# Patient Record
Sex: Female | Born: 1990 | Race: White | Hispanic: No | Marital: Married | Smoking: Never smoker
Health system: Southern US, Community
[De-identification: ages and names within clinical notes are randomized; demographics above are authoritative.]

## PROBLEM LIST (undated history)

## (undated) DIAGNOSIS — R000 Tachycardia, unspecified: Secondary | ICD-10-CM

## (undated) DIAGNOSIS — I340 Nonrheumatic mitral (valve) insufficiency: Secondary | ICD-10-CM

## (undated) DIAGNOSIS — R011 Cardiac murmur, unspecified: Secondary | ICD-10-CM

## (undated) DIAGNOSIS — I4711 Inappropriate sinus tachycardia, so stated: Secondary | ICD-10-CM

## (undated) DIAGNOSIS — Z9289 Personal history of other medical treatment: Secondary | ICD-10-CM

## (undated) DIAGNOSIS — I330 Acute and subacute infective endocarditis: Secondary | ICD-10-CM

## (undated) DIAGNOSIS — I499 Cardiac arrhythmia, unspecified: Secondary | ICD-10-CM

## (undated) DIAGNOSIS — I341 Nonrheumatic mitral (valve) prolapse: Secondary | ICD-10-CM

## (undated) DIAGNOSIS — R002 Palpitations: Secondary | ICD-10-CM

## (undated) DIAGNOSIS — F988 Other specified behavioral and emotional disorders with onset usually occurring in childhood and adolescence: Secondary | ICD-10-CM

## (undated) HISTORY — DX: Nonrheumatic mitral (valve) prolapse: I34.1

## (undated) HISTORY — PX: OTHER SURGICAL HISTORY: SHX169

## (undated) HISTORY — DX: Palpitations: R00.2

## (undated) HISTORY — DX: Other specified behavioral and emotional disorders with onset usually occurring in childhood and adolescence: F98.8

## (undated) HISTORY — PX: MOUTH SURGERY: SHX715

## (undated) HISTORY — PX: DENTAL SURGERY: SHX609

## (undated) HISTORY — DX: Inappropriate sinus tachycardia, so stated: I47.11

## (undated) HISTORY — DX: Cardiac murmur, unspecified: R01.1

## (undated) HISTORY — DX: Cardiac arrhythmia, unspecified: I49.9

## (undated) HISTORY — DX: Tachycardia, unspecified: R00.0

## (undated) HISTORY — DX: Nonrheumatic mitral (valve) insufficiency: I34.0

## (undated) HISTORY — DX: Personal history of other medical treatment: Z92.89

## (undated) HISTORY — PX: WISDOM TOOTH EXTRACTION: SHX21

## (undated) HISTORY — DX: Acute and subacute infective endocarditis: I33.0

---
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
## 2010-05-14 ENCOUNTER — Other Ambulatory Visit (HOSPITAL_COMMUNITY): Payer: Self-pay | Admitting: Family Medicine

## 2010-05-14 DIAGNOSIS — N6320 Unspecified lump in the left breast, unspecified quadrant: Secondary | ICD-10-CM

## 2010-05-22 ENCOUNTER — Other Ambulatory Visit (HOSPITAL_COMMUNITY): Payer: Self-pay

## 2010-05-22 ENCOUNTER — Ambulatory Visit (HOSPITAL_COMMUNITY)
Admission: RE | Admit: 2010-05-22 | Discharge: 2010-05-22 | Disposition: A | Discharge status: Home / Self Care | Payer: BC Managed Care – PPO | Source: Ambulatory Visit | Attending: Family Medicine | Admitting: Family Medicine

## 2010-05-22 DIAGNOSIS — N6320 Unspecified lump in the left breast, unspecified quadrant: Secondary | ICD-10-CM

## 2010-05-22 DIAGNOSIS — N62 Hypertrophy of breast: Secondary | ICD-10-CM | POA: Insufficient documentation

## 2010-05-27 LAB — COMPREHENSIVE METABOLIC PANEL
AST: 22 U/L (ref 0–37)
Albumin: 4.5 g/dL (ref 3.5–5.2)
Calcium: 9.5 mg/dL (ref 8.4–10.5)
Creatinine, Ser: 0.59 mg/dL (ref 0.4–1.2)

## 2010-05-27 LAB — DIFFERENTIAL
Eosinophils Relative: 0 % (ref 0–5)
Lymphocytes Relative: 13 % — ABNORMAL LOW (ref 24–48)
Lymphs Abs: 1.3 10*3/uL (ref 1.1–4.8)
Monocytes Absolute: 0.5 10*3/uL (ref 0.2–1.2)

## 2010-05-27 LAB — CBC
MCHC: 35.9 g/dL (ref 31.0–37.0)
MCV: 91.1 fL (ref 78.0–98.0)
Platelets: 240 10*3/uL (ref 150–400)
WBC: 10.2 10*3/uL (ref 4.5–13.5)

## 2010-05-27 LAB — PREGNANCY, URINE: Preg Test, Ur: NEGATIVE

## 2010-05-27 LAB — URINALYSIS, ROUTINE W REFLEX MICROSCOPIC
Hgb urine dipstick: NEGATIVE
Protein, ur: NEGATIVE mg/dL
Urobilinogen, UA: 0.2 mg/dL (ref 0.0–1.0)

## 2010-11-28 ENCOUNTER — Encounter: Payer: Self-pay | Admitting: Internal Medicine

## 2010-11-29 ENCOUNTER — Encounter: Payer: Self-pay | Admitting: Internal Medicine

## 2010-11-29 ENCOUNTER — Ambulatory Visit (INDEPENDENT_AMBULATORY_CARE_PROVIDER_SITE_OTHER): Payer: BC Managed Care – PPO | Admitting: Internal Medicine

## 2010-11-29 VITALS — BP 112/74 | HR 91 | Ht 63.0 in | Wt 96.0 lb

## 2010-11-29 DIAGNOSIS — I341 Nonrheumatic mitral (valve) prolapse: Secondary | ICD-10-CM

## 2010-11-29 DIAGNOSIS — R42 Dizziness and giddiness: Secondary | ICD-10-CM

## 2010-11-29 DIAGNOSIS — I059 Rheumatic mitral valve disease, unspecified: Secondary | ICD-10-CM

## 2010-11-29 DIAGNOSIS — R002 Palpitations: Secondary | ICD-10-CM

## 2010-11-29 NOTE — Progress Notes (Signed)
HPI Patient is a 20 year old with a history of MVP and MR.  She also has a hsitory of palpitations (holter with PVCs, nonsustained SVT), orthostatic intolerance.  She was previously followed by [facility].  Seen 1 year ago. SInce seen she has done well.  She is very active going to school and working part time. She notes some  palpitations.  She denies dizziness with these.. She has Rx for atenolol, 1/2 to 1/4 as needed.  She says this makes her feel more sluggish. She has dizziness occasionally with standing, changing positions.  Admits to probably not drinking enough water.   Notes headaches which she ascribes to tension. No Known Allergies  Current Outpatient Prescriptions  Medication Sig Dispense Refill  . atenolol (TENORMIN) 25 MG tablet 1/4 daily as needed         Past Medical History  Diagnosis Date  . Mitral valve prolapse   . Attention deficit disorder   . Palpitations     Past Surgical History  Procedure Date  . None     Family History  Problem Relation Age of Onset  . Heart failure    . Coronary artery disease      History   Social History  . Marital Status: Single    Spouse Name: N/A    Number of Children: 0  . Years of Education: N/A   Occupational History  . [employer]    Social History Main Topics  . Smoking status: Never Smoker   . Smokeless tobacco: Not on file  . Alcohol Use: No  . Drug Use: No  . Sexually Active: Not on file   Other Topics Concern  . Not on file   Social History Narrative   The family lives in [place]. She is now attending [employer]. She lives at home with both parents and one sibling    Review of Systems:  All systems reviewed.  They are negative to the above problem except as previously stated.  Vital Signs: BP 94/58  Pulse 86  Ht 5\' 3"  (1.6 m)  Wt 96 lb (43.545 kg)  BMI 17.01 kg/m2  Physical Exam Patient is in NAD HEENT:  Normocephalic, atraumatic. EOMI, PERRLA.  Neck: JVP is  normal. No thyromegaly. No bruits.  Lungs: clear to auscultation. No rales no wheezes.  Heart: Regular rate and rhythm. Normal S1, S2. No S3.  II/VI systolic mumur heard best over L back.Marland Kitchen PMI not displaced.  Abdomen:  Supple, nontender. Normal bowel sounds. No masses. No hepatomegaly.  Extremities:   Good distal pulses throughout. No lower extremity edema.  Musculoskeletal :moving all extremities.  Neuro:   alert and oriented x3.  CN II-XII grossly intact.  EKG:  Sinus rhythm.  86 bpm.  rSR' in V1.     Assessment and Plan:

## 2010-12-03 DIAGNOSIS — R002 Palpitations: Secondary | ICD-10-CM | POA: Insufficient documentation

## 2010-12-03 DIAGNOSIS — R42 Dizziness and giddiness: Secondary | ICD-10-CM | POA: Insufficient documentation

## 2010-12-03 DIAGNOSIS — I341 Nonrheumatic mitral (valve) prolapse: Secondary | ICD-10-CM | POA: Insufficient documentation

## 2010-12-03 NOTE — Assessment & Plan Note (Signed)
Patient is not orthostatic on exam.  BP actually goes up with standing.  I encouraged her to increase fluid and salt intake some.

## 2010-12-03 NOTE — Assessment & Plan Note (Signed)
Do not sound hemodynamically destabilizing.  Does not appear to be tolerating atenolol now.   Fluid/salt may help. I have also given her samples of Mag Oxide to see if this helps.  She does note some coincident constipation so this may help as well. Take 1 to 2 daily.  Call in a few wks with response.

## 2010-12-03 NOTE — Assessment & Plan Note (Signed)
Patient with MVP and mild MR by echo 1 year ago.  I would like her to get an echo for baseline.  She had previously been followed yearly by G. Meredeth Ide ([facility]). No limitiations on activity.

## 2010-12-31 ENCOUNTER — Telehealth: Payer: Self-pay | Admitting: Internal Medicine

## 2010-12-31 DIAGNOSIS — I340 Nonrheumatic mitral (valve) insufficiency: Secondary | ICD-10-CM

## 2010-12-31 DIAGNOSIS — I341 Nonrheumatic mitral (valve) prolapse: Secondary | ICD-10-CM

## 2010-12-31 NOTE — Telephone Encounter (Signed)
No new recs for now.  Wait on echo.

## 2010-12-31 NOTE — Telephone Encounter (Signed)
I spoke with the pt and scheduled her for an echocardiogram on 01/02/11. The pt is also complaining of problems at night.  Three nights ago the pt went to bed and woke up after an hour with her "heart beating hard in her head and ear". The pt could not go back to sleep for 2 hours and then she decided to take a Mag Ox and within 30 minutes she felt better. Two nights ago the same symptoms occurred as soon as she laid down to sleep.  The pt took another Mag Ox and her symptoms improved.  Last night the pt's symptoms started again when she laid down.  The pt took a Mag Ox and it did not help.  The pt only slept 3 hours last night.  The pt does also complain of nausea and stomach upset since taking Mag Ox. Since the pt's office visit the pt has been taking Mag Ox once a day with the exception of the past three nights. The pt is not going to take Mag Ox today.  I will forward this information to Dr Tenny Craw and Annice Pih RN to review and make further recommendations.

## 2010-12-31 NOTE — Telephone Encounter (Signed)
Pt calling re scheduling a poss echo, she thinks, need order, also has questions re magnesium and how's she been feeling

## 2011-01-02 ENCOUNTER — Ambulatory Visit (HOSPITAL_COMMUNITY): Payer: BC Managed Care – PPO | Attending: Cardiovascular Disease | Admitting: Radiology

## 2011-01-02 DIAGNOSIS — I379 Nonrheumatic pulmonary valve disorder, unspecified: Secondary | ICD-10-CM | POA: Insufficient documentation

## 2011-01-02 DIAGNOSIS — I059 Rheumatic mitral valve disease, unspecified: Secondary | ICD-10-CM | POA: Insufficient documentation

## 2011-01-02 DIAGNOSIS — I079 Rheumatic tricuspid valve disease, unspecified: Secondary | ICD-10-CM | POA: Insufficient documentation

## 2011-01-02 DIAGNOSIS — R42 Dizziness and giddiness: Secondary | ICD-10-CM | POA: Insufficient documentation

## 2011-01-02 DIAGNOSIS — I340 Nonrheumatic mitral (valve) insufficiency: Secondary | ICD-10-CM

## 2011-01-02 DIAGNOSIS — I341 Nonrheumatic mitral (valve) prolapse: Secondary | ICD-10-CM

## 2011-01-08 ENCOUNTER — Telehealth: Payer: Self-pay | Admitting: *Deleted

## 2011-01-08 DIAGNOSIS — R002 Palpitations: Secondary | ICD-10-CM

## 2011-01-08 MED ORDER — DILTIAZEM HCL 30 MG PO TABS
30.0000 mg | ORAL_TABLET | Freq: Every day | ORAL | Status: AC | PRN
Start: 2011-01-08 — End: 2012-01-08

## 2011-01-08 NOTE — Telephone Encounter (Signed)
Spoke with pt, she is aware dr Tenny Craw recommends an event monitor and diltiazem 30 mg. Script sent to pharm and order for monitor done. Pt will call in a couple weeks to report how she is doing Erin Rich

## 2011-01-14 ENCOUNTER — Encounter: Payer: Self-pay | Admitting: Internal Medicine

## 2011-02-03 ENCOUNTER — Telehealth: Payer: Self-pay | Admitting: Internal Medicine

## 2011-02-03 NOTE — Telephone Encounter (Signed)
Called Windell Moulding /re holter monitor, msg left to call back to triage room.

## 2011-02-03 NOTE — Telephone Encounter (Signed)
Spoke with mother, afraid her daughter is not being handled correctly. Someone called pt today asking if holter monitor was being worn, pt said yes dont you have my recordings? Windell Moulding did call pt back and informed her we are receiving her recordings. I see the order in the chart and I think someone called to be sure the monitor was on because there is no documentation stating that the monitor was placed or mailed. I confirmed with Windell Moulding monitor on and receiving reports. Pts mom stated her daughter is not taking diltiazem yet but will get it filled. Pt afraid it will make her feel bad like the Atenolol did. Told her this is a different drug class and will need to try. Mother agrees and will have med filled and told to take med on as needed basis for palpitations. Mother agreed they would like a f/u app, they don't feel they are on the same page and so I told her I would forward information to her nurse and to expect a call back either 12/18 or 12/20  re: app for f/u. I didn't have availability till 03/10/11. Please advise.

## 2011-02-03 NOTE — Telephone Encounter (Signed)
New message:  Pt's mom called and wanted to know if the holtor results are being reviewed by Dr. Tenny Craw.  She has been wearing this for 3 weeks and has heard nothing about these results even though she has been sending in reports.  Got a v mail asking if she had received the monitor.  Please call and discuss with mom about this situation.  Concerned that it is a mis- communication.  [phone].  Aware that Annice Pih is not taking calls this pm.

## 2011-02-06 ENCOUNTER — Telehealth: Payer: Self-pay | Admitting: Internal Medicine

## 2011-02-06 NOTE — Telephone Encounter (Signed)
Called patient Event monitor so far has no arrhythmias.   She is done with school  She said her palpitations have improved signif since she has finished classes She could try Dilt 30 1x per day and see what this does. Will call her once all strips in.

## 2011-02-06 NOTE — Telephone Encounter (Signed)
Dr.Ross called patient back. So far all strips show NSR. Will continue to monitor.

## 2011-02-27 ENCOUNTER — Telehealth: Payer: Self-pay | Admitting: *Deleted

## 2011-02-27 NOTE — Telephone Encounter (Signed)
LMOM . No sig. Arrhythmias on event monitor since last phone call. Call back if sh has any questions.

## 2011-09-08 ENCOUNTER — Telehealth: Payer: Self-pay | Admitting: Internal Medicine

## 2011-09-08 NOTE — Telephone Encounter (Signed)
Called patient back. Advised that we don't have to set up echo now. Will see Dr.Ross on 11/1 and she will decide if she needs another heart ultrasound. She was concerned that if we needed an echo it could be completed before the end of the year. Advised that we could always have time to schedule this if needed before the year is up Patient verbalized understanding.

## 2011-09-08 NOTE — Telephone Encounter (Signed)
Please return call to patient at [phone]  Patient would like to know if she will need an echo this year.

## 2011-12-18 ENCOUNTER — Ambulatory Visit (INDEPENDENT_AMBULATORY_CARE_PROVIDER_SITE_OTHER): Payer: BC Managed Care – PPO | Admitting: Obstetrics and Gynecology

## 2011-12-18 ENCOUNTER — Encounter: Payer: Self-pay | Admitting: Obstetrics and Gynecology

## 2011-12-18 VITALS — BP 100/56 | Ht 62.5 in | Wt 100.0 lb

## 2011-12-18 DIAGNOSIS — Z309 Encounter for contraceptive management, unspecified: Secondary | ICD-10-CM

## 2011-12-18 DIAGNOSIS — Z01419 Encounter for gynecological examination (general) (routine) without abnormal findings: Secondary | ICD-10-CM

## 2011-12-18 NOTE — Progress Notes (Signed)
The patient reports:wants to talk about BCP Pt has getting married in August 2014  Contraception:abstinence  Last mammogram: n/a Last pap: n/a  GC/Chlamydia cultures offered: declined HIV/RPR/HbsAg offered:  declined HSV 1 and 2 glycoprotein offered: declined  Menstrual cycle regular and monthly: Yes Menstrual flow normal: Yes  Urinary symptoms: none Normal bowel movements: Yes Reports abuse at home: No  Subjective:    Erin Rich is a 21 y.o. female, No obstetric history on file., who presents for an annual exam.     History   Social History  . Marital Status: Single    Spouse Name: N/A    Number of Children: 0  . Years of Education: N/A   Occupational History  . [employer]    Social History Main Topics  . Smoking status: Never Smoker   . Smokeless tobacco: None  . Alcohol Use: No  . Drug Use: No  . Sexually Active: Yes   Other Topics Concern  . None   Social History Narrative   The family lives in [place]. She is now attending [employer]. She lives at home with both parents and one sibling    Menstrual cycle:   LMP: Patient's last menstrual period was 11/30/2011.           Cycle: normal  The following portions of the patient's history were reviewed and updated as appropriate: allergies, current medications, past family history, past medical history, past social history, past surgical history and problem list.  Review of Systems Pertinent items are noted in HPI. Breast:Negative for breast lump,nipple discharge or nipple retraction Gastrointestinal: Negative for abdominal pain, change in bowel habits or rectal bleeding Urinary:negative   Objective:    BP 100/56  Ht 5' 2.5" (1.588 m)  Wt 100 lb (45.36 kg)  BMI 18.00 kg/m2  LMP 11/30/2011    Weight:  Wt Readings from Last 1 Encounters:  12/18/11 100 lb (45.36 kg)          BMI: Body mass index is 18.00 kg/(m^2).  General Appearance: Alert, appropriate appearance  for age. No acute distress HEENT: Grossly normal Neck / Thyroid: Supple, no masses, nodes or enlargement Lungs: clear to auscultation bilaterally Back: No CVA tenderness Breast Exam: No masses or nodes.No dimpling, nipple retraction or discharge. Cardiovascular: Regular rate and rhythm. S1, S2, no murmur Gastrointestinal: Soft, non-tender, no masses or organomegaly Pelvic Exam: Vulva and vagina appear normal. Bimanual exam reveals normal uterus and adnexa. Rectovaginal: not indicated Lymphatic Exam: Non-palpable nodes in neck, clavicular, axillary, or inguinal regions Skin: no rash or abnormalities Neurologic: Normal gait and speech, no tremor  Psychiatric: Alert and oriented, appropriate affect.     Assessment:    Normal gyn exam Contraceptive management    Plan:    Risks and benefits given of birth control methods.  Nexplanon was reviewed with the patient  With expected benefits of: 3 year duration, high reliability at 99.9%, lack of estrogen and ease of insertion. Risks of DUB and possible difficult removal discussed  Combined oral contraceptives was reviewed with the patient      With expected benefits of: cycle control, reduction in menstrual flow and dysmenorrhea, improvement of PMS and reduction of ovarian cysts and ovarian cancer. Risks of DVT/PE discussed.  Nuvaring was also reviewed With added benefit of monthly use as opposed to daily use was also reviewed.  Tobacco use: none, withouthistory of DVT/PE. Pertinent medical history: migraines not investigated with associated symptoms: vision change   return annually  or prn STD screening: declined Contraception:  Pt to decide on birth control method.  [facility]   Silverio Lay MD

## 2011-12-19 ENCOUNTER — Ambulatory Visit: Payer: BC Managed Care – PPO | Admitting: Internal Medicine

## 2012-02-16 ENCOUNTER — Ambulatory Visit: Payer: BC Managed Care – PPO | Admitting: Internal Medicine

## 2012-03-02 ENCOUNTER — Telehealth: Payer: Self-pay

## 2012-03-02 NOTE — Telephone Encounter (Signed)
VM from pt regarding BC options  Pt states that she was seen in December by SR and discussed different BC options. States that she was told to call back upon deciding which she chooses. Pt states that she is wanting to start NuvaRing. Wanting to know if she needs to come in for another apt for this or if we can just call rx into [facility] in [place]. Also if we are able to send in Rx wanting to know best day to insert. States that her most recent cycle began on 02/26/12 and just ended today 03/01/12  Darien Ramus, CMA

## 2012-03-03 NOTE — Telephone Encounter (Signed)
OK to call in Nuvaring to start 1st Sunday after beginning of cycle. BUM for the 1st month. Follow-up in 3 months

## 2012-03-04 MED ORDER — ETONOGESTREL-ETHINYL ESTRADIOL 0.12-0.015 MG/24HR VA RING: VAGINAL_RING | VAGINAL | Status: DC

## 2012-03-04 NOTE — Telephone Encounter (Signed)
Pt advised and Rx sent to pharmacy   Darien Ramus, CMA

## 2012-04-26 ENCOUNTER — Telehealth: Payer: Self-pay

## 2012-04-26 NOTE — Telephone Encounter (Signed)
VM from pt states that she has noticed some nausea and vomiting with NuvaRing. States that she first inserted on 03/28/2012 and had vomiting. Was removed on 04/18/2012, left out for 1 week. Inserted again on 04/25/2012 had vomiting. States that this only last for the day of insertion then subsides. States that she likes the NuvaRing and is wanting to continue. Wanting to know if she can get Rx for Zofran sent to pharmacy for insertion dates.   Darien Ramus, CMA

## 2012-04-27 MED ORDER — ONDANSETRON HCL 4 MG PO TABS: 4.0000 mg | ORAL_TABLET | Freq: Three times a day (TID) | ORAL | Status: DC | PRN

## 2012-04-27 NOTE — Telephone Encounter (Signed)
OK to send Zofran 4 mg po every 8 hours as needed   #20/ 3 RF

## 2012-04-27 NOTE — Telephone Encounter (Signed)
LVM to advise pt of rx  Darien Ramus, CMA

## 2012-05-03 ENCOUNTER — Encounter: Payer: Self-pay | Admitting: *Deleted

## 2012-09-28 ENCOUNTER — Telehealth: Payer: Self-pay | Admitting: Nurse Practitioner

## 2012-09-28 MED ORDER — PREDNISONE 20 MG PO TABS: ORAL_TABLET | ORAL | Status: DC

## 2012-09-28 NOTE — Telephone Encounter (Signed)
Husband in today with contact/rhus dermatitis.  Wife now has outbreak x 3 days and spreading.  Prednisone 20 mg 9 day taper.  Follow-up by the end of the week if no better.

## 2012-09-29 ENCOUNTER — Encounter: Payer: Self-pay | Admitting: Nurse Practitioner

## 2012-09-29 ENCOUNTER — Ambulatory Visit (INDEPENDENT_AMBULATORY_CARE_PROVIDER_SITE_OTHER): Payer: BC Managed Care – PPO | Admitting: Nurse Practitioner

## 2012-09-29 VITALS — BP 124/82 | Ht 62.0 in | Wt 100.6 lb

## 2012-09-29 DIAGNOSIS — L259 Unspecified contact dermatitis, unspecified cause: Secondary | ICD-10-CM

## 2012-09-29 MED ORDER — METHYLPREDNISOLONE ACETATE 40 MG/ML IJ SUSP
40.0000 mg | Freq: Once | INTRAMUSCULAR | Status: AC
  Administered 2012-09-29: 40 mg via INTRAMUSCULAR

## 2012-09-29 NOTE — Progress Notes (Signed)
Subjective:  See note from yesterday. Patient has picked up prednisone but has not started it. Has had a significant outbreak of poison oak rash, has had this before. Minimal relief with triamcinolone. Husband has a similar rash, was seen yesterday. Rash is very pruritic. No wheezing.  Objective:   BP 124/82  Ht 5\' 2"  (1.575 m)  Wt 100 lb 9.6 oz (45.632 kg)  BMI 18.4 kg/m2 NAD. Alert, oriented. Lungs clear. Heart regular rate rhythm. Multiple pink patches with papular/slightly vesicular rash noted particularly on the trunk and arms.  Assessment:Contact dermatitis - Plan: methylPREDNISolone acetate (DEPO-MEDROL) injection 40 mg  Plan: Start prednisone taper as directed tomorrow. OTC antihistamines as directed. Call back if symptoms worsen or persist.

## 2013-10-27 ENCOUNTER — Ambulatory Visit (INDEPENDENT_AMBULATORY_CARE_PROVIDER_SITE_OTHER): Payer: PRIVATE HEALTH INSURANCE | Admitting: Nurse Practitioner

## 2013-10-27 ENCOUNTER — Ambulatory Visit: Payer: PRIVATE HEALTH INSURANCE | Admitting: Nurse Practitioner

## 2013-10-27 ENCOUNTER — Encounter: Payer: Self-pay | Admitting: Nurse Practitioner

## 2013-10-27 VITALS — BP 118/76 | Temp 98.6°F | Ht 62.0 in | Wt 107.0 lb

## 2013-10-27 DIAGNOSIS — L259 Unspecified contact dermatitis, unspecified cause: Secondary | ICD-10-CM

## 2013-10-27 DIAGNOSIS — W57XXXA Bitten or stung by nonvenomous insect and other nonvenomous arthropods, initial encounter: Secondary | ICD-10-CM

## 2013-10-27 DIAGNOSIS — S30861A Insect bite (nonvenomous) of abdominal wall, initial encounter: Secondary | ICD-10-CM

## 2013-10-27 MED ORDER — PREDNISONE 20 MG PO TABS: ORAL_TABLET | ORAL | Status: DC

## 2013-10-27 MED ORDER — METHYLPREDNISOLONE ACETATE 40 MG/ML IJ SUSP
40.0000 mg | Freq: Once | INTRAMUSCULAR | Status: AC
  Administered 2013-10-27: 40 mg via INTRAMUSCULAR

## 2013-10-27 MED ORDER — DOXYCYCLINE HYCLATE 100 MG PO TABS: 100.0000 mg | ORAL_TABLET | Freq: Two times a day (BID) | ORAL | Status: DC

## 2013-10-27 NOTE — Patient Instructions (Addendum)
skyla IUD, depo provera, nexplanon    Tick Bite Information Ticks are insects that attach themselves to the skin and draw blood for food. There are various types of ticks. Common types include wood ticks and deer ticks. Most ticks live in shrubs and grassy areas. Ticks can climb onto your body when you make contact with leaves or grass where the tick is waiting. The most common places on the body for ticks to attach themselves are the scalp, neck, armpits, waist, and groin. Most tick bites are harmless, but sometimes ticks carry germs that cause diseases. These germs can be spread to a person during the tick's feeding process. The chance of a disease spreading through a tick bite depends on:   The type of tick.  Time of year.   How long the tick is attached.   Geographic location.  HOW CAN YOU PREVENT TICK BITES? Take these steps to help prevent tick bites when you are outdoors:  Wear protective clothing. Long sleeves and long pants are best.   Wear white clothes so you can see ticks more easily.  Tuck your pant legs into your socks.   If walking on a trail, stay in the middle of the trail to avoid brushing against bushes.  Avoid walking through areas with long grass.  Put insect repellent on all exposed skin and along boot tops, pant legs, and sleeve cuffs.   Check clothing, hair, and skin repeatedly and before going inside.   Brush off any ticks that are not attached.  Take a shower or bath as soon as possible after being outdoors.  WHAT IS THE PROPER WAY TO REMOVE A TICK? Ticks should be removed as soon as possible to help prevent diseases caused by tick bites. 1. If latex gloves are available, put them on before trying to remove a tick.  2. Using fine-point tweezers, grasp the tick as close to the skin as possible. You may also use curved forceps or a tick removal tool. Grasp the tick as close to its head as possible. Avoid grasping the tick on its body. 3. Pull  gently with steady upward pressure until the tick lets go. Do not twist the tick or jerk it suddenly. This may break off the tick's head or mouth parts. 4. Do not squeeze or crush the tick's body. This could force disease-carrying fluids from the tick into your body.  5. After the tick is removed, wash the bite area and your hands with soap and water or other disinfectant such as alcohol. 6. Apply a small amount of antiseptic cream or ointment to the bite site.  7. Wash and disinfect any instruments that were used.  Do not try to remove a tick by applying a hot match, petroleum jelly, or fingernail polish to the tick. These methods do not work and may increase the chances of disease being spread from the tick bite.  WHEN SHOULD YOU SEEK MEDICAL CARE? Contact your health care provider if you are unable to remove a tick from your skin or if a part of the tick breaks off and is stuck in the skin.  After a tick bite, you need to be aware of signs and symptoms that could be related to diseases spread by ticks. Contact your health care provider if you develop any of the following in the days or weeks after the tick bite:  Unexplained fever.  Rash. A circular rash that appears days or weeks after the tick bite may indicate the  possibility of Lyme disease. The rash may resemble a target with a bull's-eye and may occur at a different part of your body than the tick bite.  Redness and swelling in the area of the tick bite.   Tender, swollen lymph glands.   Diarrhea.   Weight loss.   Cough.   Fatigue.   Muscle, joint, or bone pain.   Abdominal pain.   Headache.   Lethargy or a change in your level of consciousness.  Difficulty walking or moving your legs.   Numbness in the legs.   Paralysis.  Shortness of breath.   Confusion.   Repeated vomiting.  Document Released: 02/01/2000 Document Revised: 11/24/2012 Document Reviewed: 07/14/2012 [facility] Patient Information  2015 [place], [place]. This information is not intended to replace advice given to you by your health care provider. Make sure you discuss any questions you have with your health care provider.

## 2013-10-29 ENCOUNTER — Encounter: Payer: Self-pay | Admitting: Nurse Practitioner

## 2013-10-29 NOTE — Progress Notes (Signed)
Subjective:  Presents with complaints of a rash on her left hip area/low back area for the past several days. Very pruritic. Has spread slightly. Also a tick bite to the right lower abdominal area last week. Minimal itching at the site. No documented fever. Had a significant generalized headache yesterday which has resolved. Some generalized body aches. No specific arthralgias. No sore throat, cough or runny nose. No vomiting or diarrhea. Taking fluids well. Using condoms consistently. No plans for pregnancy. LMP third week of August.  Objective:   BP 118/76  Temp(Src) 98.6 F (37 C)  Ht  (1.575 m)  Wt 107 lb (48.535 kg)  BMI 19.57 kg/m2  LMP 10/17/2013 NAD. Alert, oriented. Lungs clear. Heart regular rate rhythm. Several lines of linear pink papular/slightly vesicular rash noted on the left hip/low back area. A small pink lesion noted at the site of tick bite on the right lower abdominal area. No other rash is noted.  Assessment: Contact dermatitis - Plan: methylPREDNISolone acetate (DEPO-MEDROL) injection 40 mg  Tick bite of abdomen, initial encounter  Plan:  Meds ordered this encounter  Medications  . predniSONE (DELTASONE) 20 MG tablet    Sig: 2 po qd x 4 d then 1 po qd x 4 d    Dispense:  12 tablet    Refill:  0    Order Specific Question:  Supervising Provider    Answer:  Merlyn Albert [[id number]]  . doxycycline (VIBRA-TABS) 100 MG tablet    Sig: Take 1 tablet (100 mg total) by mouth 2 (two) times daily.    Dispense:  20 tablet    Refill:  0    Order Specific Question:  Supervising Provider    Answer:  Merlyn Albert [[id number]]  . methylPREDNISolone acetate (DEPO-MEDROL) injection 40 mg    Sig:    Although there is no documented fever, with headache and body aches, will add doxycycline to her regimen. Patient to use condoms consistently while on medications. Call back next week if no improvement, sooner if worse. Reviewed signs of tick disease. Also OTC antihistamines as  directed for itching.

## 2013-12-27 ENCOUNTER — Ambulatory Visit (INDEPENDENT_AMBULATORY_CARE_PROVIDER_SITE_OTHER): Payer: PRIVATE HEALTH INSURANCE | Admitting: Family Medicine

## 2013-12-27 ENCOUNTER — Encounter: Payer: Self-pay | Admitting: Family Medicine

## 2013-12-27 VITALS — BP 100/62 | Temp 98.7°F | Ht 62.0 in | Wt 105.8 lb

## 2013-12-27 DIAGNOSIS — B349 Viral infection, unspecified: Secondary | ICD-10-CM

## 2013-12-27 DIAGNOSIS — J310 Chronic rhinitis: Secondary | ICD-10-CM

## 2013-12-27 DIAGNOSIS — J329 Chronic sinusitis, unspecified: Secondary | ICD-10-CM

## 2013-12-27 MED ORDER — AMOXICILLIN 400 MG/5ML PO SUSR: ORAL | Status: AC

## 2013-12-27 NOTE — Progress Notes (Signed)
   Subjective:    Patient ID: Erin Rich, female    DOB: 11/02/1990, 23 y.o.   MRN: [id number]  Cough This is a new problem. The current episode started in the past 7 days. Associated symptoms include a fever, headaches and myalgias. Treatments tried: Robitussin severe cold and Advil.   Family has had similar  Flu like symptoms  Felt achey and felt bad  Very congfested   Sig fever  Very achey skin and back  tmax 100.5 dim energy level  ibu prn fever  Last nite low gr feve  Rob severe dold and cong  tmax 100.5 tod  Gunky dischearge with the cough  Loose bm's   Pt clinic, [facility]     Review of Systems  Constitutional: Positive for fever.  Respiratory: Positive for cough.   Musculoskeletal: Positive for myalgias.  Neurological: Positive for headaches.       Objective:   Physical Exam  Alert good hydration H&T moderate nasal congestion pharynx normal neck supple. Lungs bronchial cough heart regular in rhythm.     Assessment & Plan:  Impression parainfluenza with secondary rhinosinusitis/bronchitis plan a mock suspension twice a day 10 days. Since Medicare discussed. WSL

## 2015-03-10 ENCOUNTER — Encounter (HOSPITAL_COMMUNITY): Payer: Self-pay | Admitting: *Deleted

## 2015-03-10 ENCOUNTER — Emergency Department (HOSPITAL_COMMUNITY)
Admission: EM | Admit: 2015-03-10 | Discharge: 2015-03-11 | Disposition: A | Discharge status: Home / Self Care | Payer: No Typology Code available for payment source | Attending: Emergency Medicine | Admitting: Emergency Medicine

## 2015-03-10 DIAGNOSIS — S161XXA Strain of muscle, fascia and tendon at neck level, initial encounter: Secondary | ICD-10-CM

## 2015-03-10 DIAGNOSIS — S299XXA Unspecified injury of thorax, initial encounter: Secondary | ICD-10-CM | POA: Diagnosis not present

## 2015-03-10 DIAGNOSIS — Y998 Other external cause status: Secondary | ICD-10-CM | POA: Diagnosis not present

## 2015-03-10 DIAGNOSIS — Y92410 Unspecified street and highway as the place of occurrence of the external cause: Secondary | ICD-10-CM | POA: Diagnosis not present

## 2015-03-10 DIAGNOSIS — S0990XA Unspecified injury of head, initial encounter: Secondary | ICD-10-CM | POA: Insufficient documentation

## 2015-03-10 DIAGNOSIS — Z8659 Personal history of other mental and behavioral disorders: Secondary | ICD-10-CM | POA: Insufficient documentation

## 2015-03-10 DIAGNOSIS — S39012A Strain of muscle, fascia and tendon of lower back, initial encounter: Secondary | ICD-10-CM | POA: Insufficient documentation

## 2015-03-10 DIAGNOSIS — Y9389 Activity, other specified: Secondary | ICD-10-CM | POA: Diagnosis not present

## 2015-03-10 DIAGNOSIS — Z3202 Encounter for pregnancy test, result negative: Secondary | ICD-10-CM | POA: Insufficient documentation

## 2015-03-10 DIAGNOSIS — S3992XA Unspecified injury of lower back, initial encounter: Secondary | ICD-10-CM | POA: Diagnosis present

## 2015-03-10 DIAGNOSIS — Z8679 Personal history of other diseases of the circulatory system: Secondary | ICD-10-CM | POA: Diagnosis not present
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
## 2015-03-11 ENCOUNTER — Emergency Department (HOSPITAL_COMMUNITY): Payer: No Typology Code available for payment source

## 2015-03-11 DIAGNOSIS — S39012A Strain of muscle, fascia and tendon of lower back, initial encounter: Secondary | ICD-10-CM | POA: Diagnosis not present

## 2015-03-11 LAB — POC URINE PREG, ED: Preg Test, Ur: NEGATIVE

## 2015-03-11 MED ORDER — IBUPROFEN 400 MG PO TABS
400.0000 mg | ORAL_TABLET | Freq: Once | ORAL | Status: AC
  Administered 2015-03-11: 400 mg via ORAL
  Filled 2015-03-11: qty 1
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
## 2015-03-11 NOTE — ED Notes (Signed)
Ice pack applied.  No neuro changes at time of dc
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
## 2015-03-11 NOTE — ED Notes (Signed)
Urine POC Pregnancy :   Results : NEGATIVE Control : (+)

## 2015-04-10 ENCOUNTER — Other Ambulatory Visit (INDEPENDENT_AMBULATORY_CARE_PROVIDER_SITE_OTHER): Payer: Self-pay

## 2015-04-10 ENCOUNTER — Ambulatory Visit (INDEPENDENT_AMBULATORY_CARE_PROVIDER_SITE_OTHER): Payer: Self-pay

## 2015-04-10 DIAGNOSIS — Z9289 Personal history of other medical treatment: Secondary | ICD-10-CM

## 2015-04-10 HISTORY — DX: Personal history of other medical treatment: Z92.89
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
## 2017-04-10 ENCOUNTER — Ambulatory Visit (INDEPENDENT_AMBULATORY_CARE_PROVIDER_SITE_OTHER): Payer: Self-pay | Admitting: Cardiovascular Disease

## 2017-04-10 ENCOUNTER — Other Ambulatory Visit (INDEPENDENT_AMBULATORY_CARE_PROVIDER_SITE_OTHER): Payer: Self-pay

## 2017-04-10 LAB — VAHRT HISTORIC LVEF: Ejection Fraction: 65 %

## 2018-01-09 ENCOUNTER — Encounter (INDEPENDENT_AMBULATORY_CARE_PROVIDER_SITE_OTHER): Payer: Self-pay

## 2018-01-09 ENCOUNTER — Ambulatory Visit (INDEPENDENT_AMBULATORY_CARE_PROVIDER_SITE_OTHER): Payer: No Typology Code available for payment source | Admitting: Nurse Practitioner

## 2018-01-09 VITALS — BP 111/69 | HR 85 | Temp 98.2°F | Resp 18 | Ht 62.0 in | Wt 113.0 lb

## 2018-01-09 DIAGNOSIS — J069 Acute upper respiratory infection, unspecified: Secondary | ICD-10-CM

## 2018-01-09 DIAGNOSIS — R52 Pain, unspecified: Secondary | ICD-10-CM

## 2018-01-09 LAB — POCT INFLUENZA A/B
POCT Rapid Influenza A AG: NEGATIVE
POCT Rapid Influenza B AG: NEGATIVE

## 2018-01-09 MED ORDER — ALBUTEROL SULFATE HFA 108 (90 BASE) MCG/ACT IN AERS
2.00 | INHALATION_SPRAY | Freq: Four times a day (QID) | RESPIRATORY_TRACT | 0 refills | Status: DC | PRN
Start: 2018-01-09 — End: 2019-09-16

## 2018-01-09 MED ORDER — BENZONATATE 100 MG PO CAPS
100.00 mg | ORAL_CAPSULE | Freq: Three times a day (TID) | ORAL | 0 refills | Status: AC | PRN
Start: 2018-01-09 — End: 2019-01-09

## 2018-01-09 NOTE — Patient Instructions (Addendum)
Viral Upper Respiratory Illness (Adult)    You have a viral upper respiratory illness (URI), which is another term for the common cold. This illness is contagious during the first few days. It is spread through the air by coughing and sneezing. It may also be spread by direct contact (touching the sick person and then touching your own eyes, nose, or mouth). Frequent handwashing will decrease risk of spread. Most viral illnesses go away within 7 to 10 days with rest and simple home remedies. Sometimes the illness may last for several weeks. Antibiotics will not kill a virus, and they are generally not prescribed for this condition.  Home care   If symptoms are severe, rest at home for the first 2 to 3 days. When you resume activity, don't let yourself get too tired.   Don't smoke. If you need help stopping, talk with your healthcare provider.   Avoid being exposed to cigarette smoke (yours or others').   You may use acetaminophen or ibuprofen to control pain and fever, unless another medicine was prescribed.If you have chronic liver or kidney disease, have ever had a stomach ulcer or gastrointestinal bleeding, or are taking blood-thinning medicines, talk with your healthcare provider before using these medicines. Aspirin should never be given to anyone under 18 years of age who is ill with a viral infection or fever. It may cause severe liver or brain damage.   Your appetite may be poor, so a light diet is fine. Stay well hydrated by drinking 6 to 8 glasses of fluids per day (water, soft drinks, juices, tea, or soup). Extra fluids will help loosen secretions in the nose and lungs.   Over-the-counter cold medicines will not shorten the length of time you're sick, but they may be helpful for the following symptoms: cough, sore throat, and nasal and sinus congestion. If you take prescription medicines, ask your healthcare provider or pharmacist which over-the-counter medicines are safe to use. (Note: Don't use  decongestants if you have high blood pressure.)  Follow-up care  Follow up with your healthcare provider, or as advised.  When to seek medical advice  Call your healthcare provider right away if any of these occur:   Cough with lots of colored sputum (mucus)   Severe headache; face, neck, or ear pain   Difficultyswallowingdue to throat pain   Fever of 100.4F (38C) or higher, or as directed by your healthcare provider  Call 911  Call 911 if any of these occur:   Chest pain, shortness of breath, wheezing, or difficulty breathing   Coughing up blood   Very severe pain with swallowing, especially if it goes along with a muffled voice   [employer] last reviewed this educational content on 07/18/2016   2000-2019 [employer] [address], [place] [address]. All rights reserved. This information is not intended as a substitute for professional medical care. Always follow your healthcare professional's instructions.

## 2018-01-09 NOTE — Progress Notes (Signed)
[facility]  PROGRESS NOTE     Patient: Carol Sherman   Date: 01/09/2018   MRN: [id number]       Carol Sherman is a 27 y.o. female      HISTORY     Chief Complaint   Patient presents with   . Flu like symptoms     c/o nasal congestion, producing some yellowish broan mucus, , chills, ear feeling full, and body aches onset 4-5 days. pt had been taking OTC medication but no help.           URI    This is a new problem. The problem has been rapidly worsening. There has been no fever. Associated symptoms include congestion, coughing, headaches, a plugged ear sensation, rhinorrhea, sinus pain and a sore throat. Pertinent negatives include no chest pain, ear pain, nausea, vomiting or wheezing. She has tried decongestant, antihistamine and acetaminophen for the symptoms. The treatment provided mild relief.       Review of Systems   Constitutional: Positive for activity change, appetite change, chills, diaphoresis and fatigue. Negative for fever.   HENT: Positive for congestion, rhinorrhea, sinus pain and sore throat. Negative for ear pain.    Respiratory: Positive for cough. Negative for chest tightness, shortness of breath and wheezing.    Cardiovascular: Negative for chest pain.   Gastrointestinal: Negative for nausea and vomiting.   Musculoskeletal: Positive for myalgias.   Skin: Negative for wound.   Neurological: Positive for headaches.   Hematological: Negative for adenopathy. Does not bruise/bleed easily.   Psychiatric/Behavioral: Negative for sleep disturbance.       History:  Past Medical History:   Diagnosis Date   . Irregular heart beat    . Mitral valve abscess        Past Surgical History:   Procedure Laterality Date   . DENTAL SURGERY         History reviewed. No pertinent family history.    Social History     Tobacco Use   . Smoking status: Never Smoker   . Smokeless tobacco: Never Used   Substance Use Topics   . Alcohol use: Not on file   . Drug use: Never       History reviewed.         Current Outpatient Medications:   .  albuterol (PROVENTIL HFA;VENTOLIN HFA) 108 (90 Base) MCG/ACT inhaler, Inhale 2 puffs into the lungs every 6 (six) hours as needed for Wheezing (chest heaviness), Disp: 1 Inhaler, Rfl: 0  .  benzonatate (TESSALON PERLES) 100 MG capsule, Take 1 capsule (100 mg total) by mouth 3 (three) times daily as needed for Cough, Disp: 30 capsule, Rfl: 0    Allergies   Allergen Reactions   . Azithromycin Hives       Medications and Allergies reviewed.    PHYSICAL EXAM     Vitals:    01/09/18 1102   BP: 111/69   Pulse: 85   Resp: 18   Temp: 98.2 F (36.8 C)   SpO2: 98%   Weight: 51.3 kg (113 lb)   Height: 1.575 m (5\' 2" )       Physical Exam   Constitutional: She is oriented to person, place, and time. She appears well-developed and well-nourished. No distress.   HENT:   Right Ear: External ear normal.   Left Ear: External ear normal.   Mouth/Throat: Oropharynx is clear and moist.   Eyes: Conjunctivae are normal. Right eye exhibits no discharge.  Left eye exhibits no discharge.   Neck: Neck supple.   Cardiovascular: Normal rate, regular rhythm and normal heart sounds.   Pulmonary/Chest: Effort normal and breath sounds normal. No respiratory distress. She has no wheezes.   Musculoskeletal: Normal range of motion.   Lymphadenopathy:     She has no cervical adenopathy.   Neurological: She is alert and oriented to person, place, and time.   Skin: Skin is warm and dry. She is not diaphoretic.   Psychiatric: She has a normal mood and affect.     UCC COURSE       Results     Procedure Component Value Units Date/Time    Influenza A/B [[id number]] Collected:  01/09/18     Updated:  01/09/18 1125     POCT QC Pass     POCT Rapid Influenza A AG Negative     POCT Rapid Influenza B AG Negative        No results found.    Orders Placed This Encounter   Medications   . albuterol (PROVENTIL HFA;VENTOLIN HFA) 108 (90 Base) MCG/ACT inhaler     Sig: Inhale 2 puffs into the lungs every 6 (six) hours as needed for  Wheezing (chest heaviness)     Dispense:  1 Inhaler     Refill:  0   . benzonatate (TESSALON PERLES) 100 MG capsule     Sig: Take 1 capsule (100 mg total) by mouth 3 (three) times daily as needed for Cough     Dispense:  30 capsule     Refill:  0     PROCEDURES     Procedures     ASSESSMENT     Encounter Diagnoses   Name Primary?   . Body aches Yes   . Acute upper respiratory infection, unspecified       SSESSMENT  PLAN      Rest   Fluids   Mucinex PRN   Tessalon PRN   Albuterol PRN   Follow up as needed     Discussed results and diagnosis with patient/family.  Reviewed warning signs for worsening condition, as well as, indications for follow-up with pmd and return to urgent care clinic.   Patient/family expressed understanding of instructions.    Orders Placed This Encounter   Procedures   . Influenza A/B     An After Visit Summary was printed and given to the patient.    Signed,  Herschel Senegal, NP

## 2018-01-21 ENCOUNTER — Encounter (INDEPENDENT_AMBULATORY_CARE_PROVIDER_SITE_OTHER): Payer: Self-pay

## 2018-01-21 ENCOUNTER — Ambulatory Visit (INDEPENDENT_AMBULATORY_CARE_PROVIDER_SITE_OTHER): Payer: No Typology Code available for payment source | Admitting: Family

## 2018-01-21 VITALS — BP 116/79 | HR 96 | Temp 98.7°F | Resp 14 | Ht 62.0 in | Wt 113.0 lb

## 2018-01-21 DIAGNOSIS — R059 Cough, unspecified: Secondary | ICD-10-CM

## 2018-01-21 DIAGNOSIS — R05 Cough: Secondary | ICD-10-CM

## 2018-01-21 NOTE — Progress Notes (Signed)
[facility]  PROGRESS NOTE     Patient: Carol Sherman   Date: 01/21/2018   MRN: [id number]       Diannie Willner Lager is a 27 y.o. female      HISTORY     Chief Complaint   Patient presents with   . Shortness of Breath     non productive cough,exertion upon activity, dx bronchitis, completed prednisone this morning remains on doxycycline and mucinex        27 yo female patient presents with non productive cough, exertion upon activity.  Was dx with bronchitis this past Saturday when she was out of town; completed prednisone this morning remains on doxycycline and mucinex.  Pt states she still have the lingering cough.  Denies fever/chills or body aches.       Shortness of Breath   Pertinent negatives include no abdominal pain, chest pain, ear pain, fever, headaches, neck pain, rash, rhinorrhea, sore throat, vomiting or wheezing.       Review of Systems   Constitutional: Negative for appetite change, chills, diaphoresis and fever.   HENT: Negative for congestion, ear pain, hearing loss, postnasal drip, rhinorrhea, sinus pressure, sore throat and trouble swallowing.    Eyes: Negative for discharge.   Respiratory: Positive for cough and shortness of breath. Negative for chest tightness and wheezing.    Cardiovascular: Negative for chest pain.   Gastrointestinal: Negative for abdominal pain, diarrhea, nausea and vomiting.   Musculoskeletal: Negative for arthralgias, myalgias, neck pain and neck stiffness.   Skin: Negative for rash.   Allergic/Immunologic: Negative for environmental allergies.   Neurological: Negative for dizziness and headaches.   Hematological: Negative for adenopathy.   Psychiatric/Behavioral: Negative for confusion and sleep disturbance.       History:  Past Medical History:   Diagnosis Date   . Heart murmur    . Irregular heart beat    . Mitral valve abscess        Past Surgical History:   Procedure Laterality Date   . DENTAL SURGERY         Family History   Problem Relation Age of Onset   .  Hypertension Mother    . Heart disease Maternal Grandfather    . Heart attack Maternal Grandfather    . Diabetes Maternal Grandfather        Social History     Tobacco Use   . Smoking status: Never Smoker   . Smokeless tobacco: Never Used   Substance Use Topics   . Alcohol use: Yes     Frequency: Monthly or less   . Drug use: Never       History reviewed.        Current Outpatient Medications:   .  benzonatate (TESSALON PERLES) 100 MG capsule, Take 1 capsule (100 mg total) by mouth 3 (three) times daily as needed for Cough, Disp: 30 capsule, Rfl: 0  .  doxycycline (VIBRAMYCIN) 100 MG capsule, Take 100 mg by mouth 2 (two) times daily, Disp: , Rfl:   .  albuterol (PROVENTIL HFA;VENTOLIN HFA) 108 (90 Base) MCG/ACT inhaler, Inhale 2 puffs into the lungs every 6 (six) hours as needed for Wheezing (chest heaviness), Disp: 1 Inhaler, Rfl: 0    Allergies   Allergen Reactions   . Azithromycin Hives       Medications and Allergies reviewed.    PHYSICAL EXAM     Vitals:    01/21/18 0849   BP: 116/79  Pulse: 96   Resp: 14   Temp: 98.7 F (37.1 C)   SpO2: 98%   Weight: 51.3 kg (113 lb)   Height: 1.575 m (5\' 2" )       Physical Exam   Nursing note and vitals reviewed.  Constitutional: She is oriented to person, place, and time. She appears well-developed and well-nourished. No distress.   HENT:   Head: Normocephalic and atraumatic.   Right Ear: Tympanic membrane and external ear normal.   Left Ear: Tympanic membrane and external ear normal.   Mouth/Throat: Oropharynx is clear and moist. No oropharyngeal exudate.   Eyes: Conjunctivae are normal. Right conjunctiva is not injected. Left conjunctiva is not injected. No scleral icterus.   Neck: Normal range of motion. Neck supple.   Cardiovascular: Normal rate, regular rhythm and normal heart sounds.   No murmur heard.  Pulmonary/Chest: Effort normal and breath sounds normal. No respiratory distress. She has no wheezes.   Lymphadenopathy:     She has no cervical adenopathy.    Neurological: She is alert and oriented to person, place, and time.   Skin: Skin is warm and dry. No rash noted.   Psychiatric: She has a normal mood and affect.         UCC COURSE       Results     ** No results found for the last 24 hours. **            No results found.      No orders of the defined types were placed in this encounter.        PROCEDURES     Procedures       ASSESSMENT     No diagnosis found.       SSESSMENT    PLAN     1.  Lingering cough.  Finished doxycyline as prescribed by other clinic      Increase fluid      Discussed results and diagnosis with patient/family.  Reviewed warning signs for worsening condition, as well as, indications for follow-up with pmd and return to urgent care clinic.   Patient/family expressed understanding of instructions.    No orders of the defined types were placed in this encounter.        An After Visit Summary was printed and given to the patient.      Signed,  Leighton Ruff, FNP

## 2018-01-21 NOTE — Patient Instructions (Addendum)
Increase fluids  Tessalon Perles   Tylenol or Ibuprofen as needed for fever/pain.  Follow up with your PCP if symptoms persist or worsen.      Chronic Cough withUncertain Cause (Adult)    Everyone has had a cough as part of the common cold, flu, or bronchitis. This kind of cough occurs along with an achy feeling, low-grade fever, nasal and sinus congestion, and a scratchy or sore throat. This usually gets better in 2 to 3 weeks. A cough that lasts longer than 3 weeks may be due to other causes. Your healthcare provider may refer to this as a chronic cough.  If your cough does not improve over the next 2 weeks, further testing may be needed. Follow up with your healthcare provider as advised. Cough suppressants may be recommended. Based on your exam today, the exact cause of your cough is not certain. Below are some common causes for persistent cough.  Smoker's cough  Smoker's cough doesn't go away. If you continue to smoke, it only gets worse. The cough is from irritation in the air passages. Talk to your healthcare provider about quitting. Medicines or nicotine-replacement products, like gum or the patch, may make quitting easier.  Postnasal drip  A cough that is worse at night may be due to postnasal drip. Excess mucus in the nose drains from the back of your nose to your throat. This triggers the cough reflex. Postnasal drip may be due to a sinus infection or allergy. Common allergens include dust, tobacco smoke (both inhaled and secondhand smoke), environmental pollutants, pollen, mold, pets, cleaning agents, room deodorizers, and chemical fumes. Over-the-counter antihistamines or decongestants may be helpful for allergies. A sinus infection may requires antibiotic treatment. See your healthcare provider if symptoms continue.  Medicines  Certain prescribed medicines can cause a chronic cough in some people:   ACE inhibitors for high blood pressure. These include benazepril, captopril, enalapril, fosinopril,  lisinopril, quinapril, ramipril, and others.   Beta-blockers for high blood pressure and other conditions. These include propranolol, atenolol, metoprolol, nadolol, and others.  Let your healthcare provider know if you are taking any of these. The chronic cough may mean your medicine needs to be changed.  Asthma  Cough may be the only sign of mild asthma. You may have tests to find out if asthma is causing your cough. You may also take asthma medicine on a trial basis.  Acid reflux (heartburn, GERD)  The esophagus is the tube that carries food from the mouth to the stomach. A valve at its lower end prevents stomach acids from flowing upward. If this valve does not work properly, acid from the stomach enters the esophagus. This may cause a burning pain in the upper abdomen or lower chest, belching, or cough. Symptoms are often worse when lying flat. Avoid eating or drinking before bedtime. Try using extra pillows to raise your upper body, or place 4-inch blocks under the head of your bed. You may try an over-the-counter (OTC) antacid or an acid-blocking medicine such as famotidine, cimetidine, ranitidine, esomeprazole, lansoprazole, or omeprazole. Stronger medicines for this condition can be prescribed by your healthcare provider. Ask your healthcare provider which OTC medicine to use. Depending on your current medicines, some OTC medicines may cause drug interactions and should be avoided.  Follow-up care  Follow up with your healthcare provider, or as advised, if your cough does not improve. Further testing may be needed.  Note: If an X-ray was taken, a specialist will review it. You  will be notified of any new findings that may affect your care.  When to seek medical advice  Call your healthcare provider right away if any of these occur:   Mild wheezing or difficulty breathing   Fever of 100.38F (38C) or higher, or as directed by your healthcare provider   Unexpected weight loss   Coughing up large amounts  of colored sputum or blood-tinged sputum   Night sweats (sheets and pajamas get soaking wet)  Call 911  Call 911 if any of these occur:   Coughing up blood   Moderate to severe trouble breathing or wheezing  [employer]Well last reviewed this educational content on 07/18/2016   2000-2019 [employer], [place]. [address] [place], [place] [address]. All rights reserved. This information is not intended as a substitute for professional medical care. Always follow your healthcare professional's instructions.            Viral Upper Respiratory Illness (Adult)    You have a viral upper respiratory illness (URI), which is another term for the common cold. This illness is contagious during the first few days. It is spread through the air by coughing and sneezing. It may also be spread by direct contact (touching the sick person and then touching your own eyes, nose, or mouth). Frequent handwashing will decrease risk of spread. Most viral illnesses go away within 7 to 10 days with rest and simple home remedies. Sometimes the illness may last for several weeks. Antibiotics will not kill a virus, and they are generally not prescribed for this condition.  Home care   If symptoms are severe, rest at home for the first 2 to 3 days. When you resume activity, don't let yourself get too tired.   Don't smoke. If you need help stopping, talk with your healthcare provider.   Avoid being exposed to cigarette smoke (yours or others').   You may use acetaminophen or ibuprofen to control pain and fever, unless another medicine was prescribed.If you have chronic liver or kidney disease, have ever had a stomach ulcer or gastrointestinal bleeding, or are taking blood-thinning medicines, talk with your healthcare provider before using these medicines. Aspirin should never be given to anyone under 1 years of age who is ill with a viral infection or fever. It may cause severe liver or brain damage.   Your appetite may be poor, so a  light diet is fine. Stay well hydrated by drinking 6 to 8 glasses of fluids per day (water, soft drinks, juices, tea, or soup). Extra fluids will help loosen secretions in the nose and lungs.   Over-the-counter cold medicines will not shorten the length of time you're sick, but they may be helpful for the following symptoms: cough, sore throat, and nasal and sinus congestion. If you take prescription medicines, ask your healthcare provider or pharmacist which over-the-counter medicines are safe to use. (Note: Don't use decongestants if you have high blood pressure.)  Follow-up care  Follow up with your healthcare provider, or as advised.  When to seek medical advice  Call your healthcare provider right away if any of these occur:   Cough with lots of colored sputum (mucus)   Severe headache; face, neck, or ear pain   Difficultyswallowingdue to throat pain   Fever of 100.38F (38C) or higher, or as directed by your healthcare provider  Call 911  Call 911 if any of these occur:   Chest pain, shortness of breath, wheezing, or difficulty breathing   Coughing  up blood   Very severe pain with swallowing, especially if it goes along with a muffled voice   StayWell last reviewed this educational content on 07/18/2016   2000-2019 [employer]. [address], [place] [address]. All rights reserved. This information is not intended as a substitute for professional medical care. Always follow your healthcare professional's instructions.

## 2018-02-26 ENCOUNTER — Other Ambulatory Visit (INDEPENDENT_AMBULATORY_CARE_PROVIDER_SITE_OTHER): Payer: Self-pay

## 2018-02-26 ENCOUNTER — Ambulatory Visit (INDEPENDENT_AMBULATORY_CARE_PROVIDER_SITE_OTHER): Payer: Self-pay | Admitting: Cardiovascular Disease

## 2019-01-07 ENCOUNTER — Ambulatory Visit (FREE_STANDING_LABORATORY_FACILITY): Payer: No Typology Code available for payment source | Admitting: Primary Care

## 2019-01-07 ENCOUNTER — Encounter (INDEPENDENT_AMBULATORY_CARE_PROVIDER_SITE_OTHER): Payer: Self-pay | Admitting: Primary Care

## 2019-01-07 VITALS — BP 124/81 | HR 77 | Temp 98.7°F | Resp 16 | Ht 62.0 in | Wt 115.0 lb

## 2019-01-07 DIAGNOSIS — R5383 Other fatigue: Secondary | ICD-10-CM

## 2019-01-07 DIAGNOSIS — Z20828 Contact with and (suspected) exposure to other viral communicable diseases: Secondary | ICD-10-CM

## 2019-01-07 DIAGNOSIS — Z20822 Contact with and (suspected) exposure to covid-19: Secondary | ICD-10-CM

## 2019-01-07 DIAGNOSIS — M791 Myalgia, unspecified site: Secondary | ICD-10-CM

## 2019-01-07 DIAGNOSIS — R6889 Other general symptoms and signs: Secondary | ICD-10-CM

## 2019-01-07 DIAGNOSIS — R0789 Other chest pain: Secondary | ICD-10-CM

## 2019-01-07 DIAGNOSIS — R05 Cough: Secondary | ICD-10-CM

## 2019-01-07 LAB — POCT INFLUENZA A/B
POCT Rapid Influenza A AG: NEGATIVE
POCT Rapid Influenza B AG: NEGATIVE

## 2019-01-07 NOTE — Progress Notes (Signed)
[facility]  PROGRESS NOTE     Patient: Carol Sherman   Date of Service: 01/07/2019   MRN: [id number]       Carol Sherman is a 29 y.o. female      HISTORY     Chief Complaint   Patient presents with   . Covid-19 Screening     x 3 days, sob, chills, nasal congestion, stomach ache,         HPI   Carol Sherman is a 28 y.o. female that reports to the clinic today with complaints of shortness of breath, nasal congestion and chills.    First day of symptoms:  11/17  Last day of fever >100 F:  denies  Fever up to:  denies  Pt also reports chest pressure. Has a hx of mitral valve disease. Sees cardiology. Notices palpitations.   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 Negative for appetite change and diaphoresis.   HENT: Negative for congestion, ear pain, hearing loss, postnasal drip, rhinorrhea, sinus pressure, sore throat and trouble swallowing.    Eyes: Negative for discharge.   Respiratory: Positive for cough. Negative for chest tightness, shortness of breath and wheezing.    Cardiovascular: Negative for chest pain.   Gastrointestinal: Negative for abdominal pain, diarrhea, nausea and vomiting.   Musculoskeletal: Positive for myalgias. Negative for arthralgias, neck pain and neck stiffness.   Skin: Negative for rash.   Allergic/Immunologic: Negative for environmental allergies.   Neurological: Negative for dizziness and headaches.   Hematological: Negative for adenopathy.   Psychiatric/Behavioral: Negative for confusion and sleep disturbance.       History:  Past Medical History:   Diagnosis Date   . Heart murmur    . Irregular heart beat    . Mitral valve abscess        Past Surgical History:   Procedure Laterality Date   . DENTAL  SURGERY         Family History   Problem Relation Age of Onset   . Hypertension Mother    . Heart disease Maternal Grandfather    . Heart attack Maternal Grandfather    . Diabetes Maternal Grandfather        Social History     Tobacco Use   . Smoking status: Never Smoker   . Smokeless tobacco: Never Used   Substance Use Topics   . Alcohol use: Yes     Frequency: Monthly or less   . Drug use: Never       History reviewed.        Current Outpatient Medications:   .  albuterol (PROVENTIL HFA;VENTOLIN HFA) 108 (90 Base) MCG/ACT inhaler, Inhale 2 puffs into the lungs every 6 (six) hours as needed for Wheezing (chest heaviness), Disp: 1 Inhaler, Rfl: 0  .  benzonatate (TESSALON PERLES) 100 MG capsule, Take 1 capsule (100 mg total) by mouth 3 (three) times daily as needed for Cough, Disp: 30 capsule, Rfl: 0  .  doxycycline (VIBRAMYCIN) 100 MG capsule, Take 100 mg by mouth 2 (two) times daily, Disp: , Rfl:     Allergies   Allergen  Reactions   . Azithromycin Hives       Medications and Allergies reviewed.    PHYSICAL EXAM     Vitals:    01/07/19 1058   BP: 124/81   Pulse: 77   Resp: 16   Temp: 98.7 F (37.1 C)   SpO2: 99%   Weight: 52.2 kg (115 lb)   Height: 1.575 m (5\' 2" )       Physical Exam   Nursing note and vitals reviewed.  Constitutional: She is oriented to person, place, and time. She appears well-developed and well-nourished. No distress.   HENT:   Head: Normocephalic and atraumatic.   Eyes: Conjunctivae are normal.   Cardiovascular: A regularly irregular rhythm present. Tachycardia present. Exam reveals no gallop, no S3 and no S4.   Pulmonary/Chest: Effort normal and breath sounds normal. No respiratory distress. She has no wheezes. She has no rales.   Neurological: She is alert and oriented to person, place, and time.   Skin: Skin is warm and dry.   Psychiatric: She has a normal mood and affect.         UCC COURSE       Results     Procedure Component Value Units Date/Time    Influenza A/B [[id number]]  (Normal)  Collected: 01/07/19 1112     Updated: 01/07/19 1132     POCT QC Pass     POCT Rapid Influenza A AG Negative     POCT Rapid Influenza B AG Negative            No results found.      No orders of the defined types were placed in this encounter.        PROCEDURES     EKG Read    Date/Time: 01/07/2019 12:03 PM  Performed by: Justus Memory, FNP  Authorized by: Justus Memory, FNP     Previous ECG:     Previous ECG:  Unavailable  Interpretation:     Interpretation: normal    Rate:     ECG rate:  72    ECG rate assessment: normal    Rhythm:     Rhythm: sinus rhythm    Ectopy:     Ectopy: none    QRS:     QRS axis:  Normal  Conduction:     Conduction: normal    ST segments:     ST segments:  Normal  T waves:     T waves: normal             ASSESSMENT     Encounter Diagnosis   Name Primary?   . Exposure to COVID-19 virus Yes          SSESSMENT    PLAN      Reviewed EKG with Dr. Finis Bud. NSR, no concerns for ACS.    Tested for COVID19. Lung sounds CTA, VSS, in NAD, pt sitting comfortably. Reviewed social isolation, hand washing and cleaning measures for home, ER for any worsening shortness of breath, tylenol q4-6h for bodyaches.     Discussed results and diagnosis with patient/family.  Reviewed warning signs for worsening condition, as well as, indications for follow-up with pmd and return to urgent care clinic.   Patient/family expressed understanding of instructions.    Orders Placed This Encounter   Procedures   . TASK FOR EKG   . EKG Read   . COVID-19 Team Member/1st Responder   . Influenza A/B  An After Visit Summary was printed and given to the patient.      Signed,  Renato Shin, FNP

## 2019-01-07 NOTE — Patient Instructions (Signed)
Results should appear in your Mychart in 1-3 days, if you don't see it then, please call our hotline at   [phone], open 5 days a week, Monday-Friday from 8:30am-5:00pm.   Drink plenty of fluids  Tylenol every 4-6 hours, ibuprofen every 6-8 hours for headaches, bodyaches, fevers  Robitussin for cough   ER for any shortness of breath  No work until you get your test results.    Here is some general advice:     If your test result is negative, you should stay home until you do not have a fever (without the use of fever reducing medication) for at least 24 hours AND an improvement in your symptoms    If your test result is positive, you should stay home for at least 10 days from the day your symptoms started, have no fever for at least 3 days, AND have an improvement in your symptoms      Follow-up Steps for Patients with Pending COVID-19 Testing - 08/27/2018     Thank you for choosing [facility]. During your visit you received a test for COVID-19 and the test performed takes at least 3-5 days to result. The goal for results is 3-5 days, however with the current increase in testing volumes, tests can take up to 10 days for results to be ready. We appreciate your patience.      What should I do while I am waiting for my test results?   . Take good care of yourself and be mindful of the safety of those around you:   . Rest and stay well hydrated.   . Use acetaminophen (Tylenol) to help manage fever.   Carol Sherman your hands often.   . Stay at home except to get medical care.   . Call ahead before seeing your doctor, any other medical provider, or seeking medical care at any healthcare facility.   . Isolate yourself as much as possible.   . Clean all "high touch" surfaces every day.   . Most people with COVID-19 infection will get better; however, a small number will develop worsening infection which may require more intensive treatment. Please contact your doctor or present to the nearest Emergency Department  if you develop the warning signs of more severe infection such as:   o A fever over 102 not controlled by acetaminophen (Tylenol)   o Shortness of breath or chest pain   o Worsening cough   o Increasing weakness   o Inability to tolerate oral fluids   . Some general things to think about:   o If you have a medical emergency and need to call 911, notify the person answering the phone that you have, or are being evaluated for COVID-19. If possible, put on a facemask before the ambulance arrives.   o Persons who are placed under active monitoring or facilitated self-monitoring should follow instructions provided by their local health department or occupational health professionals, as appropriate    When will I know the results of my COVID test?     After a minimum of 3-5 days has passed, there are multiple ways in which you can be informed about the results of your COVID 19 test results. Please ensure we have your most up to date contact information on file before you leave your appointment.     MyChart - you may be notified of results through your MyChart account. The easiest way to find all test results done at an [facility] facility  is through your MyChart account, [facility]'s online patient portal. This is also an easy way to connect with your [facility] primary care team for continued care, if this applies to you. If you do not already have   MyChart activated, you will be given an activation code at the time of testing. Please note that for children age 42-17 MyChart is not available.     You may receive a call from your Primary Care Physician or the physician who ordered the test with the results. You may also receive a call from [facility].   If none of the options above has helped you obtain your results, or you have additional questions, please contact the COVID 19 Notifications Call Center for further instructions at [phone], open 6 days a week, Monday-Saturday from 8:30am-5:00pm.     Again, the goal for results is 3-5  days, however with the current increase in testing volumes, tests can take up to 10 days for results to be ready. We appreciate your patience.     If you need help activating your MyChart account, please let us know. You can also download the MyChart application to your phone using the QR code below.         When can I stop isolation?    Until you receive your results, you should remain home and avoid contact with others (self-isolate).The decision to stop isolation precautions should be made on a case-by-case basis, in consultation with healthcare providers and state and local health departments.     Here is some general advice:      If your test result is negative, you should stay home until you do not have a fever (without the use of fever reducing medication) or any respiratory symptoms for at least 3 days.      If your test result is positive, you should stay home for at least 10 days from the day your symptoms started. If you are still having symptoms at the end of 10 days, continue in-home isolation until 3 days after your symptoms stop. Talk to your healthcare provider to determine what follow-up is needed. You can also schedule telemedicine visits with your primary care physician to discuss ongoing symptoms or new concerns that may arise.     For further information on what you and others in your household should do please visit the Center for Disease Control (CDC) website @ [employer].si.html     If you are ill and need to be seen by a healthcare provider, and your primary physician is not available, the [facility] are open for walk in visits.     [facility] - open M - F 8 am to 8 pm at the following locations:     [facility]: [address] [place], [place] [address]     [facility]: [address], [place], [place] [address]     Open Daily 8am - 8pm including weekends at the  following location:     [facility]: [address], [place], [place] [address]     In addition, all [facility] Departments can care for patients with all conditions, both COVID and non-COVID. Do not hesitate to seek care should you have a healthcare emergency.

## 2019-01-08 ENCOUNTER — Telehealth (INDEPENDENT_AMBULATORY_CARE_PROVIDER_SITE_OTHER): Payer: Self-pay

## 2019-01-08 ENCOUNTER — Encounter (INDEPENDENT_AMBULATORY_CARE_PROVIDER_SITE_OTHER): Payer: Self-pay | Admitting: Family Medicine

## 2019-01-08 LAB — COVID-19 (SARS-COV-2): SARS CoV 2 Overall Result: NOT DETECTED

## 2019-02-22 ENCOUNTER — Encounter (INDEPENDENT_AMBULATORY_CARE_PROVIDER_SITE_OTHER): Payer: Self-pay | Admitting: Family

## 2019-02-22 ENCOUNTER — Ambulatory Visit (INDEPENDENT_AMBULATORY_CARE_PROVIDER_SITE_OTHER): Payer: Self-pay

## 2019-02-22 ENCOUNTER — Telehealth (INDEPENDENT_AMBULATORY_CARE_PROVIDER_SITE_OTHER): Payer: Self-pay | Admitting: Family

## 2019-02-22 DIAGNOSIS — Z20822 Contact with and (suspected) exposure to covid-19: Secondary | ICD-10-CM

## 2019-02-22 DIAGNOSIS — R509 Fever, unspecified: Secondary | ICD-10-CM

## 2019-02-22 NOTE — Progress Notes (Signed)
[facility]  PROGRESS NOTE     Patient: Carol Sherman   Date: 02/22/2019   MRN: [id number]     This visit is being conducted via video and telephone.   Verbal consent has been obtained from the patient to conduct a video and telephone visit to minimize exposure to COVID-19: yes    Workplace/Department:  [employer]   Role:  Patient financial coordinator     LOUANNE VILLANOVA is a 29 y.o. female     HISTORY     No chief complaint on file.       29 yo female patient ([facility]) presents with dizziness and n/v x3 started yesterday, symptoms have subsided. Pt got the rapid test yesterday which was negative.  Today pt with fatigue and low grade fever of 99.6. Denies cough or SOB.  Pt was exposed to her father in-law who tested positive on 12/31.            First day of symptoms:  yesterday  Last day of fever >100 F:  n/a    History:  Dry or Productive cough: No  Fever: Yes  Sob/dyspnea: No  Fatigue: Yes  Sore throat: No  Congestion/ Rhinorrhea:  No  Headache:  No  GI symptoms:  Yes  Loss of taste or smell:  No  Rash: No    Review of Systems   Constitutional: Positive for fatigue and fever. Negative for appetite change and chills.   HENT: Negative for congestion, ear pain, hearing loss, postnasal drip, rhinorrhea, sinus pressure, sore throat and trouble swallowing.    Eyes: Negative for discharge.   Respiratory: Negative for cough, chest tightness, shortness of breath and wheezing.    Cardiovascular: Negative for chest pain.   Gastrointestinal: Positive for nausea and vomiting. Negative for abdominal pain and diarrhea.   Musculoskeletal: Negative for arthralgias, myalgias, neck pain and neck stiffness.   Skin: Negative for rash.   Allergic/Immunologic: Negative for environmental allergies.   Neurological: Positive for dizziness. Negative for headaches.   Hematological: Negative for adenopathy.   Psychiatric/Behavioral: Negative for confusion and sleep disturbance.       Past Medical History:   Diagnosis Date   . Heart murmur     . Irregular heart beat    . Mitral valve abscess        Past Surgical History:   Procedure Laterality Date   . DENTAL SURGERY         Family History   Problem Relation Age of Onset   . Hypertension Mother    . Heart disease Maternal Grandfather    . Heart attack Maternal Grandfather    . Diabetes Maternal Grandfather        Social History     Socioeconomic History   . Marital status: Married     Spouse name: Not on file   . Number of children: Not on file   . Years of education: Not on file   . Highest education level: Not on file   Occupational History   . Not on file   Social Needs   . Financial resource strain: Not on file   . Food insecurity     Worry: Not on file     Inability: Not on file   . Transportation needs     Medical: Not on file     Non-medical: Not on file   Tobacco Use   . Smoking status: Never Smoker   . Smokeless tobacco: Never Used  Substance and Sexual Activity   . Alcohol use: Yes     Frequency: Monthly or less   . Drug use: Never   . Sexual activity: Not on file   Lifestyle   . Physical activity     Days per week: Not on file     Minutes per session: Not on file   . Stress: Not on file   Relationships   . Social Wellsite geologist on phone: Not on file     Gets together: Not on file     Attends religious service: Not on file     Active member of club or organization: Not on file     Attends meetings of clubs or organizations: Not on file     Relationship status: Not on file   . Intimate partner violence     Fear of current or ex partner: Not on file     Emotionally abused: Not on file     Physically abused: Not on file     Forced sexual activity: Not on file   Other Topics Concern   . Not on file   Social History Narrative   . Not on file       PCP:  Pcp, None, MD    History reviewed.        Current Outpatient Medications:   .  albuterol (PROVENTIL HFA;VENTOLIN HFA) 108 (90 Base) MCG/ACT inhaler, Inhale 2 puffs into the lungs every 6 (six) hours as needed for Wheezing (chest heaviness),  Disp: 1 Inhaler, Rfl: 0  .  doxycycline (VIBRAMYCIN) 100 MG capsule, Take 100 mg by mouth 2 (two) times daily, Disp: , Rfl:     Allergies   Allergen Reactions   . Azithromycin Hives             PHYSICAL EXAM     There were no vitals filed for this visit.    Physical Exam    Constitutional:       General: Not in acute distress.     Appearance: Normal appearance. Not ill-appearing or toxic-appearing.   HENT:      Head: Normocephalic and atraumatic.   Eyes:      Conjunctiva/sclera: Conjunctivae normal.   Neck:      Musculoskeletal: Normal range of motion.   Respiratory:      Normal effort. Able to speak in full sentences.  Neurological:      Mental Status: Alert and oriented.  Psychiatric:         Mood and Affect: Mood normal.         Behavior: Behavior normal.       MEDICAL DECISION MAKING     DDX:  Viral Syndrome vs. COVID-19  [place] Team Member meets criteria for COVID testing    Patient has risk factors for severe COVID-19 infection: no.     LABS     Results     ** No results found for the last 24 hours. **          ASSESSMENT     Encounter Diagnoses   Name Primary?   . Suspected COVID-19 virus infection Yes   . Fever, unspecified           ASSESSMENT    PLAN     It is recommended that you follow up for Vehicle-side Testing. You should arrive at your scheduled time at the following location:    - [address], [place], [place] [address], parking lot.  Today at 6:30      General Instructions   o Get rest and stay well hydrated  o If you have a fever you may take acetaminophen (Tylenol) as directed  o The CDC guidelines for returning to work:   - If your COVID-19 Test is NEGATIVE you may return to work if your symptoms are resolved. Your temperature should be less than 100 Fahrenheit, without fever reducing medications, for greater than 24 hours prior to returning to work.   - If your COVID-19 test is POSITIVE you may return to work 10 days from symptom onset as long as your symptoms are resolving. Your temperature  should be less than 100 Fahrenheit, without fever reducing medications, for greater than 24 hours prior to returning to work.   o Please make sure you have activated your MyChart so that you can see your test results, office notes and detailed discharge instructions.   o Flu test results will be available same day in MyChart.  o COVID-19 test results will be available in MyChart in 3-4 days. You can also call COVID-19 Call Center: [phone] for results Monday-Saturday from 8:30-5:00 pm.       Discussed diagnosis and treatment with patient.  Reviewed warning signs for worsening condition as well as indications for follow-up with pmd, return to the urgent care center or emergency department.  Patient expressed understanding of instructions.    Patient was offered the alternative of an in person visits and they declined: yes    Reviewed [place] parameters for return to work.      Orders Placed This Encounter   Procedures   . COVID-19 Team Member/1st Responder       An After Visit Summary was transmitted to the patient via MyChart.      Time spent in discussion: 20 minutes        Signed,  Leighton Ruff, FNP

## 2019-02-22 NOTE — Patient Instructions (Signed)
It is recommended that you follow up for Vehicle-side Testing. You should arrive at your scheduled time at the following location:    - [facility] parking lot, located at [address] [place], [place] [address]  - [address], [place], [place] [address], parking lot   - [facility] parking lot, located at [address] [place], [place] [address]    General Instructions   o Get rest and stay well hydrated  o If you have a fever you may take acetaminophen (Tylenol) as directed  o The CDC guidelines for returning to work:   - If your COVID-19 Test is NEGATIVE you may return to work if your symptoms are resolved. Your temperature should be less than 100 Fahrenheit, without fever reducing medications, for greater than 24 hours prior to returning to work.   - If your COVID-19 test is POSITIVE you may return to work 10 days from symptom onset as long as your symptoms are resolving. Your temperature should be less than 100 Fahrenheit, without fever reducing medications, for greater than 24 hours prior to returning to work.   o Please make sure you have activated your MyChart so that you can see your test results, office notes and detailed discharge instructions.   o Flu test results will be available same day in MyChart.  o COVID-19 test results will be available in MyChart in 3-4 days. You can also call COVID-19 Call Center: [phone] for results Monday-Saturday from 8:30-5:00 pm.

## 2019-02-23 LAB — COVID-19 (SARS-COV-2)(SOFT): SARS CoV 2 Overall Result: NOT DETECTED

## 2019-08-24 ENCOUNTER — Encounter (INDEPENDENT_AMBULATORY_CARE_PROVIDER_SITE_OTHER): Payer: Self-pay

## 2019-08-24 DIAGNOSIS — R002 Palpitations: Secondary | ICD-10-CM

## 2019-09-05 ENCOUNTER — Ambulatory Visit
Admission: RE | Admit: 2019-09-05 | Discharge: 2019-09-05 | Disposition: A | Discharge status: Home / Self Care | Payer: No Typology Code available for payment source | Source: Ambulatory Visit | Attending: Internal Medicine | Admitting: Internal Medicine

## 2019-09-05 DIAGNOSIS — R197 Diarrhea, unspecified: Secondary | ICD-10-CM | POA: Insufficient documentation

## 2019-09-05 DIAGNOSIS — R079 Chest pain, unspecified: Secondary | ICD-10-CM | POA: Insufficient documentation

## 2019-09-05 DIAGNOSIS — R000 Tachycardia, unspecified: Secondary | ICD-10-CM | POA: Insufficient documentation

## 2019-09-05 LAB — CBC AND DIFFERENTIAL
Absolute NRBC: 0 10*3/uL (ref 0.00–0.00)
Basophils Absolute Automated: 0.02 10*3/uL (ref 0.00–0.08)
Basophils Automated: 0.2 %
Eosinophils Absolute Automated: 0.02 10*3/uL (ref 0.00–0.44)
Eosinophils Automated: 0.2 %
Hematocrit: 39.8 % (ref 34.7–43.7)
Hgb: 13.4 g/dL (ref 11.4–14.8)
Immature Granulocytes Absolute: 0.03 10*3/uL (ref 0.00–0.07)
Immature Granulocytes: 0.4 %
Lymphocytes Absolute Automated: 1.24 10*3/uL (ref 0.42–3.22)
Lymphocytes Automated: 15.2 %
MCH: 31.1 pg (ref 25.1–33.5)
MCHC: 33.7 g/dL (ref 31.5–35.8)
MCV: 92.3 fL (ref 78.0–96.0)
MPV: 10.7 fL (ref 8.9–12.5)
Monocytes Absolute Automated: 0.34 10*3/uL (ref 0.21–0.85)
Monocytes: 4.2 %
Neutrophils Absolute: 6.53 10*3/uL — ABNORMAL HIGH (ref 1.10–6.33)
Neutrophils: 79.8 %
Nucleated RBC: 0 /100 WBC (ref 0.0–0.0)
Platelets: 245 10*3/uL (ref 142–346)
RBC: 4.31 10*6/uL (ref 3.90–5.10)
RDW: 13 % (ref 11–15)
WBC: 8.18 10*3/uL (ref 3.10–9.50)

## 2019-09-05 LAB — TROPONIN I: Troponin I: <0.01 ng/mL (ref 0.00–0.05)

## 2019-09-05 LAB — COMPREHENSIVE METABOLIC PANEL
ALT: 13 U/L (ref 0–55)
AST (SGOT): 19 U/L (ref 5–34)
Albumin/Globulin Ratio: 1.7 (ref 0.9–2.2)
Albumin: 4.8 g/dL (ref 3.5–5.0)
Alkaline Phosphatase: 53 U/L (ref 37–106)
Anion Gap: 9 (ref 5.0–15.0)
BUN: 12 mg/dL (ref 7.0–19.0)
Bilirubin, Total: 0.6 mg/dL (ref 0.2–1.2)
CO2: 25 mEq/L (ref 21–29)
Calcium: 9.8 mg/dL (ref 8.5–10.5)
Chloride: 105 mEq/L (ref 100–111)
Creatinine: 0.7 mg/dL (ref 0.4–1.5)
Globulin: 2.9 g/dL (ref 2.0–3.7)
Glucose: 86 mg/dL (ref 70–100)
Potassium: 4.4 mEq/L (ref 3.5–5.1)
Protein, Total: 7.7 g/dL (ref 6.0–8.3)
Sodium: 139 mEq/L (ref 136–145)

## 2019-09-05 LAB — TSH: TSH: 2.02 u[IU]/mL (ref 0.35–4.94)

## 2019-09-05 LAB — IHS D-DIMER: D-Dimer: <0.22 ug/mL FEU (ref 0.00–0.60)

## 2019-09-05 LAB — GFR: EGFR: >60

## 2019-09-05 LAB — T4, FREE: T4 Free: 1.2 ng/dL (ref 0.70–1.48)

## 2019-09-05 LAB — HEMOLYSIS INDEX: Hemolysis Index: 12 (ref 0–18)

## 2019-09-06 LAB — T3, FREE: T3, Free: 3.11 pg/mL (ref 1.71–3.71)

## 2019-09-16 ENCOUNTER — Encounter (INDEPENDENT_AMBULATORY_CARE_PROVIDER_SITE_OTHER): Payer: Self-pay | Admitting: Cardiovascular Disease

## 2019-09-16 ENCOUNTER — Ambulatory Visit (INDEPENDENT_AMBULATORY_CARE_PROVIDER_SITE_OTHER): Payer: No Typology Code available for payment source | Admitting: Cardiovascular Disease

## 2019-09-16 VITALS — BP 112/66 | HR 122 | Ht 62.0 in | Wt 111.0 lb

## 2019-09-16 DIAGNOSIS — R002 Palpitations: Secondary | ICD-10-CM

## 2019-09-16 DIAGNOSIS — I341 Nonrheumatic mitral (valve) prolapse: Secondary | ICD-10-CM

## 2019-09-16 DIAGNOSIS — Z8249 Family history of ischemic heart disease and other diseases of the circulatory system: Secondary | ICD-10-CM

## 2019-09-16 NOTE — Patient Instructions (Signed)
2 Tests:   - Echocardiogram   - 14-day continuous EKG monitor ("Zio Patch")    Google these terms (if you want):    - Mitral valve prolapse syndrome   - Supraventricular tachycardia (SVT)   - Vagal maneuvers    Here is some generic patient information about these below.    I will be in touch with test results as they come back.      Mitral Valve Prolapse    The mitral valve is one of 4 valves in your heart. They open and close to control the flow of blood into and out of the heart. The mitral valve connects the left atrium to the left ventricle. Mitral valve prolapse means that the mitral valve is loose or floppy. This is often an inherited condition. It can also occur in the setting of other cardiac disease.  Most cases of mitral valve prolapse don't cause any harm and have no symptoms. In other cases, symptoms may include:   Fast, pounding, or irregular heartbeat   Chest pains   Dizziness   Fainting spells   Shortness of breath  Some people with mitral valve prolapse may have panic attacks, anxiety, or severe tiredness (fatigue). More serious symptoms are uncommon.  Home care  Benign cases of mitral valve prolapse don't need any special treatment or limits on activity.  If your healthcare provider is able to hear a "click-murmur" in your heart:   Brush and floss your teeth regularly. This will keep your gums and teeth healthy. It will lower your risk for heart valve infection.   Tell your dentist or surgeon before you have any procedure done. Antibiotics are no longer needed before dental procedures for people with benign mitral valve prolapse.  General care  Limit how much caffeine, alcohol, and stimulants you use if you have fainting spells or pounding heartbeat (palpitations) that aren't controlled with medicine. Also don't do any strenuous activity in this case.   Follow-up care  Follow up with your healthcare provider, or as advised. You may need more tests. You should have a checkup every 2 to 3 years  so your provider can look at how your valve is working.  Call 911  Call 911 if any of these occur:-   Chest pain that's new   Chest pain that gets worse or doesn't go away with rest   Ankle swelling or shortness of breath   Fluttering, racing, or pounding heartbeat (palpitations) that lasts longer than 5 minutes   Dizzy spells, lightheadedness, orfainting   Weakness or numbness in an arm or leg, or on one side of the face   Trouble speaking or seeing  StayWell last reviewed this educational content on 08/17/2017   2000-2021 [employer], [place]. All rights reserved. This information is not intended as a substitute for professional medical care. Always follow your healthcare professional's instructions.        Supraventricular Tachycardia  What is supraventricular tachycardia?  Supraventricular tachycardias (SVT) are a group of abnormally fast heart rhythms (heartbeats). It's a problem in the electrical system of the heart. The word supraventricular means above the ventricles. With SVT, the abnormal rhythm starts in the upper heart chambers (atria). Also, known as paroxysmal supraventricular tachycardia as these fast heart rhythms may start and stop abruptly and can occur with intervals of normal heart rhythm.   Normally, a special group of cells begin the electrical signal to start your heartbeat. These cells are in the sinoatrial (SA) node. In  an adult, the sinus node sends out a regular electrical pulse 60 to 100 times per minute at rest. This node is in the right atrium, the upper right chamber of your heart. The signal quickly travels down your heart's conducting system to the ventricles, the two lower chambers of your heart. Along the way, the signal moves through the atrioventricular (AV) node, a special group of cells between your atria and your ventricles. From there, the signal travels to your left and right ventricle. As it travels, the signal triggers nearby parts of your heart to contract.  This helps your heart pump in a coordinated way.   In SVT, the signal to start your heartbeat doesn't come from the SA node. Instead, it comes from another part of the left or right atrium, or from the AV node. An area outside the SA node begins to fire quickly, causing a rapid heartbeat of over 100 beats per minute. This shortens the time your ventricles have to fill. If your heartbeat is fast enough, your heart may not be able to pump enough blood forward to the rest of your body. The abnormal heart rhythm may last for a few seconds to a few hours before your heart returns to its normal rhythm.   There are several types of SVTs:    The most common type in adults is atrioventricular nodal reentrant tachycardia (AVNRT). This occurs when you have two channels through the AV node, instead of just one. The electricity can get into a looping circuit with signals going down one channel and up the other. It can occur at any age, but it most often starts in young adulthood. It's slightly more common in women.   Another common type of SVT is atrioventricular reciprocating tachycardia (AVRT). In this condition, you are born with an extra electrical connection between the atrium and the ventricle (known as an accessory pathway) that can conduct electricity. This condition allows your heart to get caught up in a looping electrical circuit. The electricity either goes down the AV node and returns back to the atrium through the accessory pathway. Or the reverse occurs with the signal traveling down the accessory pathway and returning through the AV node. This circuit continues until it's interrupted and the tachycardia stops. This type of SVT is slightly more common in younger women and children.   Atrial tachycardia is another common type of SVT. In this case, a small group of cells in the atria begin to fire abnormally, triggering the fast heartbeat. Multifocal atrial tachycardia is a related type. In this case, multiple  groups of cells in your atria fire abnormally. These types of SVT happen more often in middle-aged people. Multifocal atrial tachycardia is more common in people with heart failure or other heart or lung diseases.  In general, SVTs are somewhat uncommon. But they are not rare. Atrial fibrillation and atrial flutter are also technically types of SVT but these are usually separated into their own category because they are associated with other risks, can last for days or even years, and have a different mechanism   What causes supraventricular tachycardia?  SVT is usually a result of faulty electrical signaling in your heart. It's commonly brought on by premature beats. Some types of SVT run in families, so genes may play a role. Other types may be caused by lung problems. It can also be linked to a number of lifestyle habits or medical problems. Some of these include:    Excess caffeine or  alcohol   Heavy smoking   Certain medicines   Heart attack   Mitral valve disease  What are the symptoms of supraventricular tachycardia?  You may not have any symptoms if you have SVT. Symptoms may vary based on how long the tachycardia lasts and how fast the heart rate is. Common symptoms include:    Chest discomfort   Shortness of breath   Fatigue   Lightheadedness or dizziness   Pulsations in the neck   Unpleasant awareness of the heartbeat (palpitations)  Fainting, more severe chest pain, and nausea are less common symptoms. Very rarely can SVT cause sudden death.   How is supraventricular tachycardia diagnosed?  Diagnosis starts with a medical history and physical exam. Your healthcare provider will also use tests to help diagnose SVT. These tests will help your provider identify the type of SVT you have. They also help your provider check for possible underlying causes and complications. Tests might include:    Electrocardiogram (ECG), the most important initial test to analyze the abnormal rhythm   Continuous  electrocardiogram, to watch your heart rhythm over a longer period   Blood work, to test for various causes   Chest X-ray, to check for lung problems and examine the size of your heart   Exercise stress test, to see how your heart works during exercise   Echocardiography, to check your heart structure and function   Electrophysiologic study (EPS), to evaluate the electrical activity and pathways in your heart  Your primary healthcare provider might first diagnose your SVT. But they will likely send you to a heart doctor (cardiologist).   How is supraventricular tachycardia treated?  SVT needs short-term and long-term treatment. Options for short-term treatment include:    Maneuvers to stop SVT   Medicines to stop SVT, like calcium channel blockers, beta blockers or adenosine   Electrocardioversion. This sends a shock to the heart to get it back to a normal rhythm.   Catheter ablation  Maneuvers are usually the first treatment unless you have severe symptoms. These attempt to activate a nerve called the vagus nerve. Activating this nerve can cause a brief slowing of your heartbeat in attempt to break the abnormal circuit. Your healthcare provider might have you do a Valsalva maneuver (you bear down with your stomach muscles, as though you were trying to have a bowel movement). Your provider might also try massaging the carotid artery in your neck, having you blow in a straw, or cough hard. Each of these techniques can sometimes bring you out of SVT. If they don't, your provider might give you medicines. If your symptoms are severe or your condition is unstable, you will usually have electrocardioversion as the first treatment. .   Long-term treatment depends on the type of SVT and the intensity of symptoms. You may not need any treatment for SVT if you have only had one episode or the episodes are very rare, especially if SVT went away with maneuvers alone. In some cases, your healthcare provider may  prescribe medicines to stop SVT that you will need to take only as needed. Beta-blockers or calcium channel blockers are common choices. This may be an option for you if you have fewer than 3 episodes of SVT per year. But the medicines may often take 15 to 30 minutes to take effect. If your SVT is more frequent, you may need to take medicine every day. Some people may need to take several medicines to prevent episodes of SVT.  Catheter ablation is now often a suggested treatment for recurring SVT. In some cases, it may be the initial recommended treatment. Ablation can often cure SVT. The procedure involves placing a small catheter through a blood vessel in the groin and threaded into your heart. Your healthcare provider then performs a small burn or small freeze on the abnormal area of your heart that is causing the fast heart rhythm. Ask your healthcare provider about what treatment strategy is right for you.   How is supraventricular tachycardia managed?  Your healthcare provider might make other recommendations to manage your SVT. These might include:    Cutting back on alcohol and caffeine   Not smoking   Reducing stress   Eating a heart-healthy diet  When should I call my healthcare provider?  Call your healthcare provider if you have severe symptoms like palpitations, lightheadedness, chest pain, or sudden shortness of breath. If your symptoms are increasing in severity or frequency, plan to see your healthcare provider as soon as possible.   Key points about supraventricular tachycardia   SVT is a type of abnormal heart rhythm. Something signals an area outside of the SA node to fire much faster than it should or something triggers the signal to follow a looping circuit. This results in a fast heartbeat that can last anywhere from a few seconds to several hours.   There are several subtypes of SVT. Your treatment options may vary based on what subtype you have.   Very rarely, SVT can cause sudden  death.   You might need a shock to the heart if you are having severe symptoms from SVT.   Some people with SVT need to take medicines only when an episode of SVT happens. Others need to take medicine all the time. Ablation is often a good option for many people.   It is important to follow your healthcare provider's instructions about medicine and lifestyle management.    Next steps  Tips to help you get the most from a visit to your healthcare provider:    Know the reason for your visit and what you want to happen.   Before your visit, write down questions you want answered.   Bring someone with you to help you ask questions and remember what your provider tells you.   At the visit, write down the name of a new diagnosis, and any new medicines, treatments, or tests. Also write down any new instructions your provider gives you.   Know why a new medicine or treatment is prescribed, and how it will help you. Also know what the side effects are.   Ask if your condition can be treated in other ways.   Know why a test or procedure is recommended and what the results could mean.   Know what to expect if you do not take the medicine or have the test or procedure.   If you have a follow-up appointment, write down the date, time, and purpose for that visit.   Know how you can contact your provider if you have questions.  StayWell last reviewed this educational content on 06/17/2017   2000-2021 [employer], [place]. All rights reserved. This information is not intended as a substitute for professional medical care. Always follow your healthcare professional's instructions.

## 2019-09-16 NOTE — Progress Notes (Signed)
[facility] OFF[facility] CONSULTATION NOTE    [facility] OFFICE -CARDIOLOGY  [address]  [place] [address]  Dept: [phone]  Dept Fax: [phone]         Patient Name: Carol Sherman    Date of Visit:  September 16, 2019  Date of Birth: May 19, 1990  AGE: 29 y.o.  Medical Record #: [id number]  Requesting Physician: Fanny Bien, NP      CHIEF COMPLAINT:  Palpitations      HISTORY OF PRESENT ILLNESS    Ms. Waltner is being seen today for cardiovascular evaluation at the request of Fanny Bien, NP. She is a pleasant 29 y.o. female who has a history of mitral valve prolapse diagnosed around age 58.  There is also family history of mitral valve prolapse in both her mother and maternal grandmother.    She has dealt with occasional palpitations over the years.  These episodes have become more frequent recently.  There was one particular day in June where she had 3 distinct episodes.  She feels associated shortness of breath and presyncope during these episodes.  Occasional visual symptoms as well ("closing in").  The episodes are abrupt in onset where her heart rate abruptly rises.  It abruptly terminates as well.  The duration of the episodes is on the order of minutes, never lasting more than an hour.  She has noticed occasional triggers over the years to include caffeine, alcohol, even in small quantities, and dehydration.  Some of the episodes seemingly occur randomly though.    Other perhaps unrelated or potentially related symptoms include atypical chest pains, recent indigestion symptoms associated with belching.  None of the symptoms are reliably exertionally worsened.  The indigestion did in fact improved with recent initiation of Protonix by her primary care provider.    I reviewed multiple prior electrocardiograms dating back to 2017 which are unremarkable.  Her most recent tracing at her primary care provider's office just 1-2  weeks ago is essentially normal, demonstrating sinus rhythm at 97 bpm with no ischemic changes and a normal axis.    She does not currently endorse any chest pain, shortness of breath, dyspnea on exertion, orthopnea, PND, edema, palpitations, nausea, diaphoresis, light-headedness, dizziness, syncope, or unusual bleeding.      PAST MEDICAL HISTORY: She has a past medical history of Abdominal Aorta Ultrasound (04/10/2015), Heart murmur, Inappropriate sinus tachycardia, Irregular heart beat, Mitral regurgitation, and Mitral valve abscess. She has a past surgical history that includes Dental surgery; broken tibia (2001); and Wisdom tooth extraction.    ALLERGIES:   Allergies   Allergen Reactions   . Azithromycin Hives     MEDICATIONS:   Current Outpatient Medications   Medication Sig   . pantoprazole (Protonix) 40 MG tablet every 24 hours        FAMILY HISTORY: family history includes Diabetes in her maternal grandfather; Heart attack in her maternal grandfather; Heart disease in her maternal grandfather; Heart failure in her maternal grandfather; Hypertension in her mother; Other in her mother; Supraventricular tachycardia in an other family member.    SOCIAL HISTORY: She reports that she has never smoked. She has never used smokeless tobacco. She reports current alcohol use. She reports that she does not use drugs.    REVIEW OF SYSTEMS:   General: Denies recent weight loss, weight gain, fever or chills or change in exercise tolerance.;   Integumentary: Denies any change in hair or  nails, rashes, or skin lesions.;   Eyes: Denies diplopia, glaucoma or visual field defects.;   Ears, Nose, Throat, Mouth: Denies any hearing loss, epistaxis, hoarseness or difficulty speaking.;  Respiratory: Denies dyspnea, cough, wheezing or hemoptysis.;   Cardiovascular: Please review HPI;   Abdominal : Denies ulcer disease, hematochezia or melena.;  Musculoskeletal:Denies any venous insufficiency, arthritic symptoms or back problems.;    Neurological : Denies any recurrent strokes, TIA, or seizure disorder.;   Psychiatric: Denies any depression, substance abuse or change in cognitive functions.;   Endocrine: Denies any weight change, heat/cold intolerance, polydipsia, or polyuria;   Hematologic/Immunologic: Denies any food allergies, seasonal allergies, bleeding disorders.   All other systems reviewed and negative except as above.       PHYSICAL EXAMINATION    Visit Vitals  BP 112/66 (BP Site: Left arm, Patient Position: Sitting)   Pulse (!) 122   Ht 1.575 m (5\' 2" )   Wt 50.3 kg (111 lb)   BMI 20.30 kg/m        General Appearance:  A well-appearing female in no acute distress.    Skin: Warm and dry to touch, no apparent skin lesions, or masses noted.  Head: Normocephalic, normal hair pattern, no masses or tenderness   Eyes: EOMS Intact, PERRL, conjunctivae and lids unremarkable.  ENT: Ears, Nose and throat reveal no gross abnormalities.  No pallor or cyanosis.  Neck: JVP normal, no carotid bruit, thyroid not enlarged   Chest: Clear to auscultation bilaterally with good air movement and respiratory effort and no wheezes, rales, or rhonchi   Cardiovascular: Regular rhythm, normal rate, midsystolic click, no murmur  Abdomen: Soft, nontender, nondistended, with normoactive bowel sounds. No organomegaly.  No pulsatile masses, or bruits.   Extremities: Warm without edema. No clubbing, or cyanosis. All peripheral pulses are full and equal.   Neuro: Alert and oriented x3. No gross motor or sensory deficits noted, affect appropriate.        ECG: Sinus rhythm with a single blocked premature atrial contraction and a single premature ventricular contraction      LABS:   Lab Results   Component Value Date    WBC 8.18 09/05/2019    HGB 13.4 09/05/2019    HCT 39.8 09/05/2019    PLT 245 09/05/2019     Lab Results   Component Value Date    GLU 86 09/05/2019    BUN 12.0 09/05/2019    CREAT 0.7 09/05/2019    NA 139 09/05/2019    K 4.4 09/05/2019    CL 105 09/05/2019     CO2 25 09/05/2019    AST 19 09/05/2019    ALT 13 09/05/2019     Lab Results   Component Value Date    TSH 2.02 09/05/2019       IMPRESSION:   Ms. Pho is a 29 y.o. female with the following problems:     Abrupt onset palpitation episodes where her heart rate doubles her triples, sometimes triggered by certain known triggers such as caffeine/alcohol/dehydration, but usually seemingly at random.  These episodes have occurred infrequently throughout her life but have been accelerating in recent weeks-months.  She had 3 episodes last month in June.  They tend to last on the order of minutes and spontaneously terminate with complete normalization abruptly.  Highly suspicious for paroxysmal supraventricular tachycardia.   Mitral valve prolapse by history, prior echocardiography, and consistent on exam with a midsystolic click.  No significant mitral regurgitant murmur and the most recent  echocardiogram in 2017 demonstrated only trace mitral regurgitation.   Atypical chest pains.  Nonexertional and somewhat sharp and brief in quality.  Noncoronary, and may be noncardiac, though may be a part of mitral valve prolapse syndrome.   Family history of mitral valve prolapse in both her mother and her maternal grandmother.   Strong family history of premature atherosclerotic heart disease and numerous second and third-degree relatives including grandparents and great uncles on the mother side and a paternal aunt on the father's side.   Recent indigestion/belching symptoms with some improvement on Protonix.   Occasional premature beats on electrocardiogram today.  Otherwise a normal tracing.      RECOMMENDATIONS:     2 wee continuous ambulatory ECG monitor to hopefully capture at least one of these abrupt onset tachycardia episodes to make the arrhythmia diagnosis.   Echocardiogram to reassess the mitral valve prolapse, assess for any mitral regurgitation or mitral annular disjunction, and inform a surveillance  interval going forward.   I did give her information on mitral valve prolapse, supraventricular tachycardia, vagal maneuvers, and told her to go with the mitral valve prolapse syndrome.  We also discussed the spectrum of options for dealing with tacky arrhythmias ranging from reassurance to lifestyle changes to vagal maneuvers to as needed medications to daily suppressive medications to catheter ablation.  She has seen Dr. Sedonia Small in the past and if we do identify a supraventricular tachyarrhythmia I would refer her back to him for his input.   Further recommendations are pending the results of the aforementioned evaluation.  We will be in touch with testing results as I review them.                                                 Orders Placed This Encounter   Procedures   . Long-term Holter Monitor   . ECG 12 lead (Normal)   . Echocardiogram Adult Complete W Clr/ Dopp Waveform       No orders of the defined types were placed in this encounter.        SIGNED:    Arcola Jansky, MD         This note was generated by the Dragon speech recognition and may contain errors or omissions not intended by the user. Grammatical errors, random word insertions, deletions, pronoun errors, and incomplete sentences are occasional consequences of this technology due to software limitations. Not all errors are caught or corrected. If there are questions or concerns about the content of this note or information contained within the body of this dictation, they should be addressed directly with the author for clarification.

## 2019-10-03 ENCOUNTER — Encounter (INDEPENDENT_AMBULATORY_CARE_PROVIDER_SITE_OTHER): Payer: No Typology Code available for payment source

## 2019-10-04 ENCOUNTER — Ambulatory Visit (INDEPENDENT_AMBULATORY_CARE_PROVIDER_SITE_OTHER): Payer: No Typology Code available for payment source

## 2019-10-04 DIAGNOSIS — R002 Palpitations: Secondary | ICD-10-CM

## 2019-10-04 NOTE — Progress Notes (Signed)
Pt enrolled online for 14 day CAM, monitor was given to pt in office

## 2019-10-07 ENCOUNTER — Ambulatory Visit (INDEPENDENT_AMBULATORY_CARE_PROVIDER_SITE_OTHER): Payer: No Typology Code available for payment source | Admitting: Radiology

## 2019-10-07 DIAGNOSIS — R002 Palpitations: Secondary | ICD-10-CM

## 2019-10-07 DIAGNOSIS — I341 Nonrheumatic mitral (valve) prolapse: Secondary | ICD-10-CM

## 2019-10-10 ENCOUNTER — Encounter (INDEPENDENT_AMBULATORY_CARE_PROVIDER_SITE_OTHER): Payer: Self-pay | Admitting: Cardiovascular Disease

## 2019-10-10 ENCOUNTER — Other Ambulatory Visit (INDEPENDENT_AMBULATORY_CARE_PROVIDER_SITE_OTHER): Payer: Self-pay | Admitting: Cardiovascular Disease

## 2019-10-10 DIAGNOSIS — I341 Nonrheumatic mitral (valve) prolapse: Secondary | ICD-10-CM

## 2019-10-10 DIAGNOSIS — R002 Palpitations: Secondary | ICD-10-CM

## 2019-10-25 ENCOUNTER — Ambulatory Visit
Admission: RE | Admit: 2019-10-25 | Discharge: 2019-10-25 | Disposition: A | Discharge status: Home / Self Care | Payer: No Typology Code available for payment source | Source: Ambulatory Visit | Attending: Cardiovascular Disease | Admitting: Cardiovascular Disease

## 2019-10-25 DIAGNOSIS — I341 Nonrheumatic mitral (valve) prolapse: Secondary | ICD-10-CM | POA: Insufficient documentation

## 2019-10-25 DIAGNOSIS — R002 Palpitations: Secondary | ICD-10-CM | POA: Insufficient documentation

## 2019-10-25 MED ORDER — GADOBUTROL 1 MMOL/ML IV SOLN
20.00 mL | Freq: Once | INTRAVENOUS | Status: AC | PRN
Start: 2019-10-25 — End: 2019-10-25
  Administered 2019-10-25: 20 mmol via INTRAVENOUS
  Filled 2019-10-25: qty 20

## 2019-11-02 ENCOUNTER — Encounter (INDEPENDENT_AMBULATORY_CARE_PROVIDER_SITE_OTHER): Payer: Self-pay | Admitting: Cardiovascular Disease

## 2019-11-03 ENCOUNTER — Encounter (INDEPENDENT_AMBULATORY_CARE_PROVIDER_SITE_OTHER): Payer: No Typology Code available for payment source | Admitting: Cardiovascular Disease

## 2019-11-03 ENCOUNTER — Other Ambulatory Visit (INDEPENDENT_AMBULATORY_CARE_PROVIDER_SITE_OTHER): Payer: Self-pay | Admitting: Cardiovascular Disease

## 2019-11-03 DIAGNOSIS — I471 Supraventricular tachycardia: Secondary | ICD-10-CM | POA: Insufficient documentation

## 2019-11-03 DIAGNOSIS — R002 Palpitations: Secondary | ICD-10-CM

## 2019-11-03 NOTE — Progress Notes (Signed)
CARDIAC AMBULATORY ECG MONITOR REPORT    HRT FAIR [facility] HEART FAIR [facility] OFFICE -CARDIOLOGY  [address]  [place] [address]  Dept: [phone]  Dept Fax: [phone]    NAME: Carol Sherman    SEX: female  DOB: 11/20/1990 (29 y.o.)   MRN: [id number]     STUDY DATE: 11/03/2019   INTERPRETATION DATE: 11/03/2019     REFERRING PHYSICIAN: Fanny Bien, NP          INDICATION:   1. Palpitations    This is a 2-week continuous ambulatory ECG monitor worn between October 07, 2019 and October 20, 2019.    FINDINGS:   1. The average heart rate was 78 bpm, ranging 43-195 bpm.  2. There were no sustained atrial or ventricular tachyarrhythmias.  3. There was no nonsustained ventricular tachycardia.  4. There were a few nonsustained episodes of paroxysmal supraventricular tachycardia (SVT) which did correlate with the patient's 3 triggered symptom events (10/08/19 ~2:20pm, 10/12/19 ~7:40am, and 10/16/19 ~5:55pm).  These episodes lasted 6-15 seconds at average heart rates between 150-195 bpm.  The longest episode lasted 15 seconds at an average rate of 171 bpm  5. There was no atrial fibrillation or atrial flutter.  6. There were no bradyarrhythmias, episodes of heart block, or ventricular pauses.  7. There was a trivial frequency of benign ectopy well under 1% of the total beats.    IMPRESSIONS: Reassuring 2-week continuous ambulatory monitoring device demonstrating no sustained arrhythmias, no nonsustained ventricular tachycardia, 3 brief nonsustained SVT episodes lasting up to 15 seconds, and a very low frequency of ectopy.    RECOMMENDATIONS: Reassurance.  These results will be given to the patient.  We will discuss initiation of low-dose beta-blocker for prophylaxis.  I will also make arrangements for her to undergo an exercise treadmill stress test to ensure there were no exercise-induced arrhythmia phenomena.      The tracings for the above mentioned monitor can be requested by  calling [facility] at [phone], ext. [phone].    Elliot Dally. Eliseo Gum, MD, [facility], [facility]

## 2019-11-10 ENCOUNTER — Ambulatory Visit (INDEPENDENT_AMBULATORY_CARE_PROVIDER_SITE_OTHER): Payer: No Typology Code available for payment source | Admitting: Cardiovascular Disease

## 2019-11-10 DIAGNOSIS — I471 Supraventricular tachycardia: Secondary | ICD-10-CM

## 2019-11-10 NOTE — Procedures (Signed)
EXERCISE STRESS TEST    HRT FAIR [facility] OFFICE -CARDIOLOGY  [address]  [place] [address]  Dept: [phone]  Dept Fax: [phone]     Patient: Carol Sherman  Sex: Female   DOB: Dec 30, 1990 (28 y.o.)  MRN:  [id number]     Test Date:  11/10/2019       Interpretation Date: 11/10/2019    Referring Physician: Fanny Bien, NP     CLINICIAN: Maida Sale  SUPERVISING PROVIDER: Harlow Asa, MD [facility]    MEDICATIONS:  Patient has a current medication list which includes the following prescription(s): pantoprazole - every 24 hours.    INDICATION: Palpitations    GRADED ECG EXERCISE TEST:    ----- Protocol and Exercise Time -----   Protocol Bruce   Exercise Time (min) 10:00   ----- Heart Rate -----   Resting HR 89 BPM bpm   Maximum HR 189 bpm   METS 12 METS   Percent Maximum Predicted HR 98 %    -----Blood Pressure -----   Resting BP 106/15mmHg   Maximum BP 122/76 mmHg     Patient Symptoms: dyspnea and lightheaded, resolved during recovery    Reason for end: Dyspnea and protocol completed    Resting ECG: NSR: 89 BPM  ST Changes: Negative  Stress ECG:      Arrhythmias: Frequent PVCs    INTERPRETATION:  1. Normal blood pressure response to exercise.  2. Exercise capacity average for age and gender.  3. No chest pain or ischemic ECG changes with exercise.    CONCLUSIONS:  Normal maximal exercise treadmill test with no clinical or ECG evidence of ischemia.      Results and recommendations discussed with the patient.      INTERPRETED BY:

## 2019-11-24 ENCOUNTER — Other Ambulatory Visit (INDEPENDENT_AMBULATORY_CARE_PROVIDER_SITE_OTHER): Payer: Self-pay | Admitting: Cardiovascular Disease

## 2019-11-24 MED ORDER — METOPROLOL SUCCINATE ER 25 MG PO TB24
25.00 mg | ORAL_TABLET | Freq: Every day | ORAL | 3 refills | Status: DC
Start: 2019-11-24 — End: 2020-04-25

## 2019-12-10 ENCOUNTER — Encounter (INDEPENDENT_AMBULATORY_CARE_PROVIDER_SITE_OTHER): Payer: Self-pay

## 2020-04-11 ENCOUNTER — Emergency Department
Admission: EM | Admit: 2020-04-11 | Discharge: 2020-04-11 | Disposition: A | Discharge status: Home / Self Care | Payer: No Typology Code available for payment source | Attending: Emergency Medical Services | Admitting: Emergency Medical Services

## 2020-04-11 ENCOUNTER — Emergency Department: Payer: No Typology Code available for payment source

## 2020-04-11 DIAGNOSIS — R55 Syncope and collapse: Secondary | ICD-10-CM | POA: Insufficient documentation

## 2020-04-11 DIAGNOSIS — R112 Nausea with vomiting, unspecified: Secondary | ICD-10-CM | POA: Insufficient documentation

## 2020-04-11 DIAGNOSIS — I341 Nonrheumatic mitral (valve) prolapse: Secondary | ICD-10-CM | POA: Insufficient documentation

## 2020-04-11 DIAGNOSIS — R002 Palpitations: Secondary | ICD-10-CM

## 2020-04-11 DIAGNOSIS — I471 Supraventricular tachycardia: Secondary | ICD-10-CM

## 2020-04-11 DIAGNOSIS — R531 Weakness: Secondary | ICD-10-CM

## 2020-04-11 LAB — COMPREHENSIVE METABOLIC PANEL
ALT: 13 U/L (ref 0–55)
AST (SGOT): 18 U/L (ref 5–34)
Albumin/Globulin Ratio: 1.6 (ref 0.9–2.2)
Albumin: 4.5 g/dL (ref 3.5–5.0)
Alkaline Phosphatase: 51 U/L (ref 37–106)
Anion Gap: 9 (ref 5.0–15.0)
BUN: 15 mg/dL (ref 7–19)
Bilirubin, Total: 0.4 mg/dL (ref 0.2–1.2)
CO2: 22 mEq/L (ref 22–29)
Calcium: 8.9 mg/dL (ref 8.5–10.5)
Chloride: 107 mEq/L (ref 100–111)
Creatinine: 0.8 mg/dL (ref 0.6–1.0)
Globulin: 2.8 g/dL (ref 2.0–3.6)
Glucose: 110 mg/dL — ABNORMAL HIGH (ref 70–100)
Potassium: 3.6 mEq/L (ref 3.5–5.1)
Protein, Total: 7.3 g/dL (ref 6.0–8.3)
Sodium: 138 mEq/L (ref 136–145)

## 2020-04-11 LAB — CBC AND DIFFERENTIAL
Absolute NRBC: 0 10*3/uL (ref 0.00–0.00)
Basophils Absolute Automated: 0.02 10*3/uL (ref 0.00–0.08)
Basophils Automated: 0.5 %
Eosinophils Absolute Automated: 0.08 10*3/uL (ref 0.00–0.44)
Eosinophils Automated: 1.8 %
Hematocrit: 40.9 % (ref 34.7–43.7)
Hgb: 13.9 g/dL (ref 11.4–14.8)
Immature Granulocytes Absolute: 0.01 10*3/uL (ref 0.00–0.07)
Immature Granulocytes: 0.2 %
Lymphocytes Absolute Automated: 0.8 10*3/uL (ref 0.42–3.22)
Lymphocytes Automated: 18.1 %
MCH: 30.8 pg (ref 25.1–33.5)
MCHC: 34 g/dL (ref 31.5–35.8)
MCV: 90.7 fL (ref 78.0–96.0)
MPV: 9.4 fL (ref 8.9–12.5)
Monocytes Absolute Automated: 0.4 10*3/uL (ref 0.21–0.85)
Monocytes: 9.1 %
Neutrophils Absolute: 3.1 10*3/uL (ref 1.10–6.33)
Neutrophils: 70.3 %
Nucleated RBC: 0 /100 WBC (ref 0.0–0.0)
Platelets: 179 10*3/uL (ref 142–346)
RBC: 4.51 10*6/uL (ref 3.90–5.10)
RDW: 13 % (ref 11–15)
WBC: 4.41 10*3/uL (ref 3.10–9.50)

## 2020-04-11 LAB — LIPASE: Lipase: 42 U/L (ref 8–78)

## 2020-04-11 LAB — URINALYSIS REFLEX TO MICROSCOPIC EXAM - REFLEX TO CULTURE
Bilirubin, UA: NEGATIVE
Glucose, UA: NEGATIVE
Ketones UA: NEGATIVE
Nitrite, UA: NEGATIVE
Protein, UR: NEGATIVE
Specific Gravity UA: 1.005 (ref 1.001–1.035)
Urine pH: 7 (ref 5.0–8.0)
Urobilinogen, UA: NORMAL mg/dL (ref 0.2–2.0)

## 2020-04-11 LAB — GLUCOSE WHOLE BLOOD - POCT: Whole Blood Glucose POCT: 102 mg/dL — ABNORMAL HIGH (ref 70–100)

## 2020-04-11 LAB — HCG, SERUM, QUALITATIVE: Hcg Qualitative: NEGATIVE

## 2020-04-11 LAB — ECG 12-LEAD
Atrial Rate: 97 {beats}/min
P Axis: 69 degrees
P-R Interval: 172 ms
Q-T Interval: 370 ms
QRS Duration: 80 ms
QTC Calculation (Bezet): 469 ms
R Axis: 101 degrees
T Axis: 42 degrees
Ventricular Rate: 97 {beats}/min

## 2020-04-11 LAB — GFR: EGFR: >60

## 2020-04-11 LAB — IHS D-DIMER: D-Dimer: 0.82 ug/mL FEU — ABNORMAL HIGH (ref 0.00–0.60)

## 2020-04-11 MED ORDER — SODIUM CHLORIDE 0.9 % IV BOLUS
1000.00 mL | Freq: Once | INTRAVENOUS | Status: AC
Start: 2020-04-11 — End: 2020-04-11
  Administered 2020-04-11: 1000 mL via INTRAVENOUS

## 2020-04-11 MED ORDER — ONDANSETRON HCL 4 MG/2ML IJ SOLN
4.00 mg | Freq: Once | INTRAMUSCULAR | Status: DC
Start: 2020-04-11 — End: 2020-04-11
  Filled 2020-04-11: qty 2

## 2020-04-11 MED ORDER — ONDANSETRON 4 MG PO TBDP
4.0000 mg | ORAL_TABLET | Freq: Four times a day (QID) | ORAL | 0 refills | Status: DC | PRN
Start: 2020-04-11 — End: 2020-11-27

## 2020-04-11 MED ORDER — IOHEXOL 350 MG/ML IV SOLN
100.0000 mL | Freq: Once | INTRAVENOUS | Status: AC | PRN
Start: 2020-04-11 — End: 2020-04-11
  Administered 2020-04-11: 100 mL via INTRAVENOUS

## 2020-04-11 MED ORDER — ONDANSETRON HCL 4 MG/2ML IJ SOLN
4.00 mg | Freq: Once | INTRAMUSCULAR | Status: AC
Start: 2020-04-11 — End: 2020-04-11
  Administered 2020-04-11: 4 mg via INTRAVENOUS
  Filled 2020-04-11: qty 2

## 2020-04-11 NOTE — ED Triage Notes (Signed)
30 y/o female present to the ED with a c/o of emesis and near syncope. Reports diarrhea starting last night and continuing this AM. Reports emesis this AM and near syncope in the shower. Reports bilateral hand tingling and difficulty speaking. Reports symptoms resolving upon arrival.

## 2020-04-11 NOTE — ED Provider Notes (Signed)
[facility]        [facility] EMERGENCY DEPARTMENT HISTORY AND PHYSICAL EXAM    Date: 04/11/2020  Patient Name: Carol Sherman  Attending Physician: Clovis Riley, MD  Patient DOB:  18-Aug-1990  MRN:  [id number]  Room:  08/A08        History     Chief Complaint   Patient presents with   .  Lightheaded, hand tingling, difficulty speaking       Historian: Patient and her husband  Onset:   1 1/2 hours prior to arrival  Severity:  severe   Modifiers: worse when standing  Quality: Lightheaded, numbness and tingling in both hands, difficulty finding words     The patient Carol Sherman, is a 30 y.o. female with a history of nonsustained SVT and mitral valve prolapse who presents with lightheadedness, hand tingling, and difficulty speaking.  She has been on a modified keto diet for about 4 weeks to see if it will help her joint pain.  Yesterday she had some carbs and then developed some abdominal discomfort described as "turning and cramping" and had three episodes of diarrhea.  This morning she woke up initially felt fine and got in the shower to get ready for work.  She experienced some palpitations which is common for her but they continued and were associated with severe lightheadedness.  She then had more diarrhea and felt dehydrated so drank a significant mount of water and vomited.  She subsequently developed numbness and tingling in her hands and difficulty applying Chapstick to her dry lips and difficulty finding words.  Her husband noticed that she seemed to be having some difficulty breathing and seemed anxious.    On the way to the hospital and on arrival her symptoms have improved significantly but have not completely resolved.          PCP:  Cristela Felt, MD      Past Medical History       Past Medical History:   Diagnosis Date   . Abdominal Aorta Ultrasound 04/10/2015   . Heart murmur    . Inappropriate sinus tachycardia    . Irregular heart beat    .  Mitral regurgitation    . Mitral valve abscess          Past Surgical History       Past Surgical History:   Procedure Laterality Date   . broken tibia  2001   . DENTAL SURGERY     . WISDOM TOOTH EXTRACTION           Family History    Family History   Problem Relation Age of Onset   . Hypertension Mother    . Other Mother         Mitral Valve regurgitation   . Heart disease Maternal Grandfather    . Heart attack Maternal Grandfather    . Diabetes Maternal Grandfather    . Heart failure Maternal Grandfather    . Supraventricular tachycardia Other        Social History    Social History     Socioeconomic History   . Marital status: Married     Spouse name: None   . Number of children: None   . Years of education: None   . Highest education level: None   Occupational History   . None   Tobacco Use   . Smoking status: Never Smoker   . Smokeless tobacco: Never Used  Vaping Use   . Vaping Use: Never used   Substance and Sexual Activity   . Alcohol use: Yes   . Drug use: Never   . Sexual activity: None   Other Topics Concern   . None   Social History Narrative   . None     Social Determinants of Health     Financial Resource Strain:    . Difficulty of Paying Living Expenses: Not on file   Food Insecurity:    . Worried About Programme researcher, broadcasting/film/video in the Last Year: Not on file   . Ran Out of Food in the Last Year: Not on file   Transportation Needs:    . Lack of Transportation (Medical): Not on file   . Lack of Transportation (Non-Medical): Not on file   Physical Activity:    . Days of Exercise per Week: Not on file   . Minutes of Exercise per Session: Not on file   Stress:    . Feeling of Stress : Not on file   Social Connections:    . Frequency of Communication with Friends and Family: Not on file   . Frequency of Social Gatherings with Friends and Family: Not on file   . Attends Religious Services: Not on file   . Active Member of Clubs or Organizations: Not on file   . Attends Banker Meetings: Not on file    . Marital Status: Not on file   Intimate Partner Violence:    . Fear of Current or Ex-Partner: Not on file   . Emotionally Abused: Not on file   . Physically Abused: Not on file   . Sexually Abused: Not on file   Housing Stability:    . Unable to Pay for Housing in the Last Year: Not on file   . Number of Places Lived in the Last Year: Not on file   . Unstable Housing in the Last Year: Not on file       Allergies    Allergies   Allergen Reactions   . Azithromycin Hives         Current/Home Medications    Discharge Medication List as of 04/11/2020 12:49 PM      CONTINUE these medications which have NOT CHANGED    Details   metoprolol succinate XL (TOPROL-XL) 25 MG 24 hr tablet Take 1 tablet (25 mg total) by mouth daily, Starting Thu 11/24/2019, Until Fri 11/23/2020, E-Rx      pantoprazole (Protonix) 40 MG tablet every 24 hours, Historical Med             Home meds reviewed by ED MD       Vital Signs     BP 100/63   Pulse 83   Temp 97.9 F (36.6 C) (Oral)   Resp 22   Wt 50.7 kg   SpO2 98%   BMI 20.44 kg/m   Patient Vitals for the past 24 hrs:   BP Temp Temp src Pulse Resp SpO2 Weight   04/11/20 1300 -- -- -- 83 -- 98 % --   04/11/20 1230 100/63 -- -- 84 -- 98 % --   04/11/20 1156 118/71 -- -- (!) 101 -- 98 % --   04/11/20 1100 -- -- -- 94 -- 98 % --   04/11/20 1000 105/70 -- -- (!) 106 -- 100 % --   04/11/20 0900 102/65 -- -- 89 22 98 % --   04/11/20 0755 -- -- --  97 (!) 26 -- --   04/11/20 0741 107/76 97.9 F (36.6 C) Oral (!) 116 -- 98 % --   04/11/20 0737 -- -- -- -- 18 -- 50.7 kg         Review of Systems     Review of Systems   Constitutional: Positive for malaise/fatigue.   Respiratory: Positive for shortness of breath.    Cardiovascular: Positive for palpitations.   Gastrointestinal: Positive for abdominal pain, diarrhea, nausea and vomiting.   Neurological: Positive for dizziness and tingling.         Physical Exam     CONSTITUTIONAL   Vital Signs Reviewed, Afebrile, Pleasant  HEAD   Atraumatic,  Normocephalic.  EYES   Normal to inspection, No discharge from eyes.  ENT    Mucus membranes dry, No pharyngeal edema.    NECK   Normal inspection, No JVD.  RESPIRATORY CHEST   No respiratory distress, Breath sounds normal.  CARDIOVASCULAR   Normal rate and regular rhythm.  ABDOMEN   Non-distended, Normal bowel sounds, Soft, No tenderness, No peritoneal signs.  BACK   Normal inspection, No tenderness.  UPPER EXTREMITY   Inspection normal, No cyanosis.  LOWER EXTREMITY   No edema, No cyanosis.  NEURO   Alert and oriented, Normal speech, cranial nerves II through XII intact, no focal motor or sensory deficits, normal finger-to-nose bilaterally.  SKIN   Warm and dry without obvious rash  PSYCHIATRIC   Alert and oriented, anxious affect, Normal concentration.      ED Medication Orders     ED Medication Orders (From admission, onward)    Start Ordered     Status Ordering Provider    04/11/20 1121 04/11/20 1121  iohexol (OMNIPAQUE) 350 MG/ML injection 100 mL  IMG once as needed        Route: Intravenous  Ordered Dose: 100 mL     Last MAR action: Imaging Agent Given Tiarrah  W    04/11/20 0940 04/11/20 0939  ondansetron (ZOFRAN) injection 4 mg  Once        Route: Intravenous  Ordered Dose: 4 mg     Last [facility] action: Not Given Littie Deeds, Pj Zehner W    04/11/20 0838 04/11/20 0837  ondansetron (ZOFRAN) injection 4 mg  Once        Route: Intravenous  Ordered Dose: 4 mg     Last MAR action: Given Sonnie Bias W    04/11/20 0838 04/11/20 0837  sodium chloride 0.9 % bolus 1,000 mL  Once        Route: Intravenous  Ordered Dose: 1,000 mL     Last MAR action: Stopped Domingo Fuson W          Orders Placed During this Encounter     Orders Placed This Encounter   Procedures   . CT Angio Chest (PE study)   . CBC and differential   . Comprehensive metabolic panel   . Lipase   . Urinalysis Reflex to Microscopic Exam- Reflex to Culture   . GFR   . Beta HCG, Qual, Serum   . D-Dimer   . Diet NPO effective now   . Cardiac Event Monitor (30  days)   . Glucose Whole Blood - POCT   . ECG 12 Lead   . Saline lock IV   . Hospital Follow Up Visit (HRT [facility])   . OFFICE VISIT 30 MIN (HRT [facility])       Diagnostic Study Results     Labs  Results     Procedure Component Value Units Date/Time    D-Dimer [[id number]]  (Abnormal) Collected: 04/11/20 0757     Updated: 04/11/20 0933     D-Dimer 0.82 ug/mL FEU     Urinalysis Reflex to Microscopic Exam- Reflex to Culture [[id number]]  (Abnormal) Collected: 04/11/20 0905     Updated: 04/11/20 4401     Urine Type Urine, Clean Ca     Color, UA Straw     Clarity, UA Clear     Specific Gravity UA 1.005     Urine pH 7.0     Leukocyte Esterase, UA Trace     Nitrite, UA Negative     Protein, UR Negative     Glucose, UA Negative     Ketones UA Negative     Urobilinogen, UA Normal mg/dL      Bilirubin, UA Negative     Blood, UA Moderate     RBC, UA 3 - 5 /hpf      WBC, UA 6 - 10 /hpf      Squamous Epithelial Cells, Urine 0 - 5 /hpf     Beta HCG, Qual, Serum [[id number]] Collected: 04/11/20 0757     Updated: 04/11/20 0842     Hcg Qualitative Negative    Comprehensive metabolic panel [[id number]]  (Abnormal) Collected: 04/11/20 0757    Specimen: Blood Updated: 04/11/20 0830     Glucose 110 mg/dL      BUN 15 mg/dL      Creatinine 0.8 mg/dL      Sodium 563 mEq/L      Potassium 3.6 mEq/L      Chloride 107 mEq/L      CO2 22 mEq/L      Calcium 8.9 mg/dL      Protein, Total 7.3 g/dL      Albumin 4.5 g/dL      AST (SGOT) 18 U/L      ALT 13 U/L      Alkaline Phosphatase 51 U/L      Bilirubin, Total 0.4 mg/dL      Globulin 2.8 g/dL      Albumin/Globulin Ratio 1.6     Anion Gap 9.0    Lipase [[id number]] Collected: 04/11/20 0757    Specimen: Blood Updated: 04/11/20 0830     Lipase 42 U/L     GFR [[id number]] Collected: 04/11/20 0757     Updated: 04/11/20 0830     EGFR >60.0    CBC and differential [[id number]] Collected: 04/11/20 0757    Specimen: Blood Updated: 04/11/20 0806     WBC 4.41 x10 3/uL      Hgb 13.9 g/dL      Hematocrit 32.3 %       Platelets 179 x10 3/uL      RBC 4.51 x10 6/uL      MCV 90.7 fL      MCH 30.8 pg      MCHC 34.0 g/dL      RDW 13 %      MPV 9.4 fL      Neutrophils 70.3 %      Lymphocytes Automated 18.1 %      Monocytes 9.1 %      Eosinophils Automated 1.8 %      Basophils Automated 0.5 %      Immature Granulocytes 0.2 %      Nucleated RBC 0.0 /100 WBC      Neutrophils Absolute 3.10 x10 3/uL      Lymphocytes Absolute  Automated 0.80 x10 3/uL      Monocytes Absolute Automated 0.40 x10 3/uL      Eosinophils Absolute Automated 0.08 x10 3/uL      Basophils Absolute Automated 0.02 x10 3/uL      Immature Granulocytes Absolute 0.01 x10 3/uL      Absolute NRBC 0.00 x10 3/uL     Glucose Whole Blood - POCT [[id number]]  (Abnormal) Collected: 04/11/20 0741     Updated: 04/11/20 0744     Whole Blood Glucose POCT 102 mg/dL           Radiologic Studies  Radiology Results (24 Hour)     Procedure Component Value Units Date/Time    CT Angio Chest (PE study) [[id number]] Collected: 04/11/20 1149    Order Status: Completed Updated: 04/11/20 1152    Narrative:      HISTORY: Near syncope. Evaluate for PE.    COMPARISON: None    TECHNIQUE: CTA chest was performed with non-ionic intravenous contrast  per PE protocol. 3-D/MIP images were submitted and reviewed. The  following dose reduction techniques were utilized: automated exposure  control and/or adjustment of the mA and/or kV according to patient size,  and the use of iterative reconstruction technique.    Contrast: 100 cc Omnipaque 350 administered.     FINDINGS:    No evidence of pulmonary embolism. No enlarged lymph nodes in the chest.  Heart within normal limits in size without pericardial effusion. Normal  caliber thoracic aorta. Clear lungs. No pleural effusion or  pneumothorax. Unremarkable visualized upper abdomen. Unremarkable  osseous structures.      Impression:         No evidence of pulmonary embolism.    Carlynn Spry, MD   04/11/2020 11:50 AM      .      EKG       EKG (interpreted by Marjorie Smolder, M.D.):  At [phone]  Normal sinus rhythm at 95, normal intervals, right axis deviation, no acute ST or T wave abnormality      Clinical Course / MDM       Notes:     Stroke alert called on patient arrival.  After speaking with her and examining her I am confident her presentation is not consistent with a stroke but rather dehydration with secondary hyperventilation and anxiety.  Stroke alert canceled.      Patient with symptomatic improvement with IV normal saline bolus and IV Zofran x2.  Her D-dimer was elevated and therefore CTA of the chest was performed which shows no concerning abnormalities.  Patient was seen by cardiology in the ED and set up for a 30-day cardiac monitor.  She is stable for discharge with her husband with follow-up to return if worse.      The patient understands and agrees with the plan.      The patient was seen and evaluated during the time of COVID pandemic.  Significant limitations can be present in the evaluation and management of ED patients during pandemic conditions, including but not limited to lack of testing, rapidly changing IHS protocols, limited evaluation and follow-up resources.          This note was generated by the Epic EMR system/ Dragon speech recognition and may contain inherent errors or omissions not intended by the user. Grammatical errors, random word insertions, deletions, pronoun errors and incomplete sentences are occasional consequences of this technology due to software limitations. Not all errors are caught or corrected. If there are  questions or concerns about the content of this note or information contained within the body of this dictation they should be addressed directly with the author for clarification.        Data Review     Nursing records reviewed and agree: Yes    Pulse Oximetry Analysis - Normal  Laboratory results reviewed by EDP: Yes  Radiologic study results reviewed by EDP: Yes    Rendering Provider: Marjorie Smolder, MD        Critical Care          Clinical Impression & Disposition     Clinical Impression:  1. Near syncope    2. Nausea and vomiting, intractability of vomiting not specified, unspecified vomiting type    3. MVP (mitral valve prolapse)        Disposition  ED Disposition     ED Disposition Condition Date/Time Comment    Discharge  Wed Apr 11, 2020 12:49 PM Carol Sherman discharge to home/self care.    Condition at disposition: Stable          Prescriptions    Discharge Medication List as of 04/11/2020 12:49 PM      START taking these medications    Details   ondansetron (Zofran ODT) 4 MG disintegrating tablet Take 1 tablet (4 mg total) by mouth every 6 (six) hours as needed for Nausea, Starting Wed 04/11/2020, Normal                      Clovis Riley, MD  04/11/20 1324

## 2020-04-11 NOTE — ED Notes (Signed)
Stoke alert cancelled by MD Littie Deeds after assessment of the pt.

## 2020-04-11 NOTE — Consults (Addendum)
[place] [facility] CARDI[facility] CONSULTATION REPORT  [facility]    Date Time: 04/11/20 12:18 PMPatient Name: Carol Sherman  Requesting Physician: Clovis Riley, MD       Reason for Consultation:   Near syncope      History:   Hiawatha Dressel is a 30 y.o. female admitted on 04/11/2020.  We have been asked by Clovis Riley, MD,  to provide cardiac consultation, regarding near syncope.  PMHx of MVP and PSVT not currently on AV nodal therapy.  Low-dose Toprol was prescribed late last year after a CAM showed three brief episodes of PSVT correlating to symptoms, but she never started it since her episodes had been fairly quiet.    Awoke this morning feeling well.  When she got in the shower she started to feel lightheaded.  This was associated with palpitations and shortness of breath, similar symptoms to her PSVT episodes.  She had garbled speech and started feeling bilateral tingling in her hands.  She had diarrhea (also an episode yesterday) and then tried to drink some water but vomited.  Her husband brought her to the ER.  On the way to the hospital, her symptoms started to resolve.    Initial vital signs in the ER showed BP 107/76 and afebrile.  ECG showed sinus rhythm with heart rate 97 bpm and right axis deviation.  No acute ST-T changes.  Blood work revealed an elevated D-dimer.  CTA chest is pending.  Remainder of blood work was unrevealing.  UA showed trace leukocytosis and moderate blood.  Telemetry monitor showing sinus rhythm with rates in the 90s.    Currently resting comfortably with husband at her bedside.  Speech is normal.  She is still a bit anxious.  Has been feeling well recently without fever or cough.  She denies history of CVA or TIA.  Also no history of PE or DVT.  She did drive back from [place] this past weekend.  It was a 4-hour drive and she did get out once to use the bathroom.  No current leg pain or swelling.  She has recently been on a keto diet.   She drinks about 64 ounces of fluids daily.      Past Medical History:     Past Medical History:   Diagnosis Date   . Abdominal Aorta Ultrasound 04/10/2015   . Heart murmur    . Inappropriate sinus tachycardia    . Irregular heart beat    . Mitral regurgitation    . Mitral valve abscess        Past Surgical History:     Past Surgical History:   Procedure Laterality Date   . broken tibia  2001   . DENTAL SURGERY     . WISDOM TOOTH EXTRACTION         Family History:     Family History   Problem Relation Age of Onset   . Hypertension Mother    . Other Mother         Mitral Valve regurgitation   . Heart disease Maternal Grandfather    . Heart attack Maternal Grandfather    . Diabetes Maternal Grandfather    . Heart failure Maternal Grandfather    . Supraventricular tachycardia Other        Social History:     Social History     Socioeconomic History   . Marital status: Married     Spouse name: Not on file   .  Number of children: Not on file   . Years of education: Not on file   . Highest education level: Not on file   Occupational History   . Not on file   Tobacco Use   . Smoking status: Never Smoker   . Smokeless tobacco: Never Used   Vaping Use   . Vaping Use: Never used   Substance and Sexual Activity   . Alcohol use: Yes   . Drug use: Never   . Sexual activity: Not on file   Other Topics Concern   . Not on file   Social History Narrative   . Not on file     Social Determinants of Health     Financial Resource Strain:    . Difficulty of Paying Living Expenses: Not on file   Food Insecurity:    . Worried About Programme researcher, broadcasting/film/video in the Last Year: Not on file   . Ran Out of Food in the Last Year: Not on file   Transportation Needs:    . Lack of Transportation (Medical): Not on file   . Lack of Transportation (Non-Medical): Not on file   Physical Activity:    . Days of Exercise per Week: Not on file   . Minutes of Exercise per Session: Not on file   Stress:    . Feeling of Stress : Not on file   Social Connections:     . Frequency of Communication with Friends and Family: Not on file   . Frequency of Social Gatherings with Friends and Family: Not on file   . Attends Religious Services: Not on file   . Active Member of Clubs or Organizations: Not on file   . Attends Banker Meetings: Not on file   . Marital Status: Not on file   Intimate Partner Violence:    . Fear of Current or Ex-Partner: Not on file   . Emotionally Abused: Not on file   . Physically Abused: Not on file   . Sexually Abused: Not on file   Housing Stability:    . Unable to Pay for Housing in the Last Year: Not on file   . Number of Places Lived in the Last Year: Not on file   . Unstable Housing in the Last Year: Not on file       Allergies:     Allergies   Allergen Reactions   . Azithromycin Hives       Medications:   (Not in a hospital admission)        Current Facility-Administered Medications   Medication Dose Route Frequency Provider Last Rate Last Admin   . ondansetron (ZOFRAN) injection 4 mg  4 mg Intravenous Once Clovis Riley, MD         Current Outpatient Medications   Medication Sig Dispense Refill   . metoprolol succinate XL (TOPROL-XL) 25 MG 24 hr tablet Take 1 tablet (25 mg total) by mouth daily 90 tablet 3   . pantoprazole (Protonix) 40 MG tablet every 24 hours           Review of Systems:    Comprehensive review of systems including constitutional, eyes, ears, nose, mouth, throat, cardiovascular, GI, GU, musculoskeletal, integumentary, respiratory, neurologic, psychiatric, and endocrine is negative other than what is mentioned already in the history of present illness    Physical Exam:     Vitals:    04/11/20 1156   BP: 118/71   Pulse: (!) 101  Resp:    Temp:    SpO2: 98%     Temp (24hrs), Avg:97.9 F (36.6 C), Min:97.9 F (36.6 C), Max:97.9 F (36.6 C)      Intake and Output Summary (Last 24 hours) at Date Time    Intake/Output Summary (Last 24 hours) at 04/11/2020 1218  Last data filed at 04/11/2020 1004  Gross per 24 hour    Intake 1000 ml   Output --   Net 1000 ml       GENERAL: Patient is in no acute distress   HEENT: No scleral icterus or conjunctival pallor, moist mucous membranes   NECK: No jugular venous distention or thyromegaly, normal carotid upstrokes without bruits   CARDIAC: Normal apical impulse, regular rate and rhythm, with normal S1 and S2, and no murmurs, rubs, or gallops, mid systolic click  CHEST: Clear to auscultation bilaterally, normal respiratory effort on RA  ABDOMEN: Soft, non-tender, non-distended, good bowel sounds   EXTREMITIES: No edema, 2+ PT and radial pulses bilaterally  SKIN: No rash or jaundice   NEUROLOGIC: Alert and oriented to time, place and person, normal mood and affect  MUSCULOSKELETAL: Normal muscle strength and tone.      Labs Reviewed:                 Recent Labs   Lab 04/11/20  0757   Bilirubin, Total 0.4   Protein, Total 7.3   Albumin 4.5   ALT 13   AST (SGOT) 18             Recent Labs   Lab 04/11/20  0757   WBC 4.41   Hgb 13.9   Hematocrit 40.9   Platelets 179     Recent Labs   Lab 04/11/20  0757   Sodium 138   Potassium 3.6   Chloride 107   CO2 22   BUN 15   Creatinine 0.8   EGFR >60.0   Glucose 110*   Calcium 8.9         Radiology   Radiological Procedure reviewed.        Assessment:    Admitted for near syncope - PE in differential given elevated D-dimer, ECG with right axis deviation and recent long car ride. Possible recurrent symptomatic PSVT episode vs vasovagal episode +/- mild dehydration from diarrhea, vomiting and shower?   PSVT with abrupt brief episodes that respond to vagal maneuvers - 14-day CAM 10/2019 showed average HR 78 bpm (range 43-195 bpm), no sustained arrhythmias, 3 nonsustained episodes of PSVT correlating to pt-triggered symptoms (lasting 6-15 sec with HR ranging 150-195 bpm, longest episode 15 sec with average HR 171 bpm), trivial benign ectopy < 1% burden, no afib/flutter, no bradyarrhythmias.   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 Trace MR by most recent echo 10/07/19.   Preserved LVEF   Atypical chest pains.  Nonexertional and somewhat sharp and brief in quality.  Noncoronary, and may be noncardiac, though may be a part of mitral valve prolapse syndrome.   Family history of mitral valve prolapse in both her mother and her maternal grandmother.   Strong family history of premature atherosclerotic heart disease and numerous second and third-degree relatives including grandparents and great uncles on the mother side and a paternal aunt on the father's side.  Exercise treadmill stress test 11/10/19 - neg for ischemia, no exercise-induced arrhythmias, noted frequent PVCs.    Recommendations:    Awaiting CTA chest results to rule out PE   If CTA chest is unremarkable then can [place] from a cardiac standpoint after Dr. Marcelo Baldy sees the pt   Recommended she start Toprol-XL 12.5mg  daily tonight (she already has the pills at home)   She will start monitoring her BP and HR at home - she tends to run SBP in the 100s range at baseline   We will pursue a 30-day MCT to rule out any concerning arrhythmias in light of her near syncopal episode   Continue aggressive hydration   We will also arrange office f/u in 2 weeks            Signed by: Lyn Hollingshead, NP     Attestation:  The patient has been seen and examined. VS reviewed. My Exam as below:    Physical Exam  Constitutional: Cooperative, alert, well-developed, well-nourished, in no acute distress  Skin: warm and dry to touch  Head: normocephalic  Eyes: Conjunctivae and lids normal  Cardiovascular: regular rate and rhythm, normal S1, S2, no S3, no S4, no murmurs, rubs or gallops  Neck: no carotid bruits, JVP normal  Lungs: clear to auscultation bilaterally, without wheezing,  rhonchi, or rales  Extremities: no edema  Pulses: +2 and equal pulses in all extremities  Musculoskeletal: normal strength and tone  Neurological: Appropriate affect, oriented x 3, intact and without gross motor defect    My assessment and plan as detailed above. In addition, please note the following: CTA was nor,a;      Joni Reining, MD      [facility]  APP Spectralink [phone] (8am-12pm)  MD Spectralink [phone] (8am-5pm)  After hours, non urgent consult line [phone]  After Hours, urgent consults or to reach the on-call MD [phone]

## 2020-04-11 NOTE — Discharge Instructions (Signed)
Dear Ms. Carol Sherman:    I appreciate your choosing the [facility] for your healthcare needs, and hope your visit today was EXCELLENT.    Instructions:  Please follow-up with Dr. Clayborn Heron, Dr Eliseo Gum and your new rheumatologist.    Return to the Emergency Department for any worsening symptoms or concerns.    Below is some information that our patients often find helpful.    We wish you good health and please do not hesitate to contact us if we can ever be of any assistance.    Sincerely,  Clovis Riley, MD  [facility]    ________________________________________________________________    If you do not continue to improve or your condition worsens, please contact your doctor or return immediately to the Emergency Department.    Thank you for choosing [facility] for your emergency care needs.  We strive to provide EXCELLENT care to you and your family.      DOCTOR REFERRALS  Call [phone] if you need any further referrals and we can help you find a primary care doctor or specialist.  Also, available online at:  [email or web address]    YOUR CONTACT INFORMATION  Before leaving please check with registration to make sure we have an up-to-date contact number.  You can call registration at [phone] to update your information.  For questions about your hospital bill, please call [phone].  For questions about your Emergency Dept Physician bill please call [phone].      FREE HEALTH SERVICES  If you need help with health or social services, please call 2-1-1 for a free referral to resources in your area.  2-1-1 is a free service connecting people with information on health insurance, free clinics, pregnancy, mental health, dental care, food assistance, housing, and substance abuse counseling.  Also, available online at:  http://www.[facility].org    MEDICAL RECORDS AND TESTS  Certain laboratory test results do  not come back the same day, for example urine cultures.   We will contact you if other important findings are noted.  Radiology films are often reviewed again to ensure accuracy.  If there is any discrepancy, we will notify you.      Please call [phone] to pick up a complimentary CD of any radiology studies performed.  If you or your doctor would like to request a copy of your medical records, please call [phone].      ORTHOPEDIC INJURY   Please know that significant injuries can exist even when an initial x-ray is read as normal or negative.  This can occur because some fractures (broken bones) are not initially visible on x-rays.  For this reason, close outpatient follow-up with your primary care doctor or bone specialist (orthopedist) is required.    MEDICATIONS AND FOLLOWUP  Please be aware that some prescription medications can cause drowsiness.  Use caution when driving or operating machinery.    The examination and treatment you have received in our Emergency Department is provided on an emergency basis, and is not intended to be a substitute for your primary care physician.  It is important that your doctor checks you again and that you report any new or remaining problems at that time.      24 HOUR PHARMACIES  [facility] - [address] [place], [place] [address] (1.4 miles, 7 minutes)  [facility] - [address], [place], [place] [address] (6.5 miles, 13 minutes)  Handout with  directions available on request

## 2020-04-12 ENCOUNTER — Encounter (INDEPENDENT_AMBULATORY_CARE_PROVIDER_SITE_OTHER): Payer: Self-pay

## 2020-04-12 ENCOUNTER — Ambulatory Visit (INDEPENDENT_AMBULATORY_CARE_PROVIDER_SITE_OTHER): Payer: No Typology Code available for payment source

## 2020-04-12 DIAGNOSIS — R55 Syncope and collapse: Secondary | ICD-10-CM

## 2020-04-12 NOTE — Progress Notes (Signed)
Patient enrolled online for [employer] home delivery and flowsheet created. Patient messaged via MyChart with instructions.

## 2020-04-25 ENCOUNTER — Ambulatory Visit (INDEPENDENT_AMBULATORY_CARE_PROVIDER_SITE_OTHER): Payer: No Typology Code available for payment source | Admitting: Adult Health

## 2020-04-25 ENCOUNTER — Encounter (INDEPENDENT_AMBULATORY_CARE_PROVIDER_SITE_OTHER): Payer: Self-pay | Admitting: Adult Health

## 2020-04-25 VITALS — BP 100/62 | HR 86 | Ht 62.0 in | Wt 113.0 lb

## 2020-04-25 DIAGNOSIS — R55 Syncope and collapse: Secondary | ICD-10-CM

## 2020-04-25 DIAGNOSIS — I341 Nonrheumatic mitral (valve) prolapse: Secondary | ICD-10-CM

## 2020-04-25 DIAGNOSIS — R002 Palpitations: Secondary | ICD-10-CM

## 2020-04-25 MED ORDER — METOPROLOL SUCCINATE ER 25 MG PO TB24
25.0000 mg | ORAL_TABLET | Freq: Every day | ORAL | 3 refills | Status: DC
Start: 2020-04-25 — End: 2020-11-27

## 2020-04-25 NOTE — Progress Notes (Signed)
[place] [facility] CARDIOLOG[facility] OFFICE PROGRESS NOTE    [facility] OFFICE -CARDIOLOGY  [address]  [place] [address]  Dept: [phone]  Dept Fax: [phone]       Patient Name: Carol Sherman    Date of Visit:  April 25, 2020  Date of Birth: 11/03/1990  AGE: 30 y.o.  Medical Record #: [id number]  Requesting Physician: Cristela Felt, MD      CHIEF COMPLAINT: Palpitations      HISTORY OF PRESENT ILLNESS:    She is a pleasant 30 y.o. female who presents today for hospital follow-up.  She was evaluated at the [facility] ER on February 23 following a prolonged 2-hour episode of palpitations associated with near syncope.  Although she has had similar symptoms in the past the episodes have not traditionally lasted that long.  She had an elevated D-dimer.  CTA chest was negative for PE.  The episode was suspected to be vasovagal with recent dehydration from vomiting/diarrhea and a hot shower.  She was subsequently mailed a 30-day MCT monitor and is currently wearing it (she applied it about a week ago).  She was instructed to start Toprol 12.5 mg daily which she did (this had been previously prescribed in the outpatient setting but she had not yet started it).    After leaving the ER she continued to have diarrhea for 9 days.  She was seen at an urgent care clinic and subsequently by her PCP.  She tells me she had abnormal thyroid tests suggesting possible overactive thyroid.  Plan is to repeat these blood tests in the near future.  She is also scheduled to see a rheumatologist in a couple of weeks for persistently elevated ANA (not a new finding) associated with joint pains.    She continues to have palpitations associated with lightheadedness beyond what she considers her baseline despite being on low-dose beta-blocker therapy.  Occasionally she feels short of breath and sharp fleeting chest pains.  She has had a few episodes of palpitations  that have woken her from sleep and sometimes it will take an hour or so before they calm down and she is able to go back to sleep.  In general she has just been feeling a bit off.  She wonders if this is related to her recent prolonged diarrhea (which has since resolved) and abnormal thyroid tests.      PAST MEDICAL HISTORY: She has a past medical history of Abdominal Aorta Ultrasound (04/10/2015), Heart murmur, Inappropriate sinus tachycardia, Irregular heart beat, Mitral regurgitation, and Mitral valve abscess. She has a past surgical history that includes Dental surgery; broken tibia (2001); and Wisdom tooth extraction.    ALLERGIES:   Allergies   Allergen Reactions   . Azithromycin Hives       MEDICATIONS:   Current Outpatient Medications   Medication Sig   . metoprolol succinate XL (TOPROL-XL) 25 MG 24 hr tablet Take 1 tablet (25 mg total) by mouth daily   . pantoprazole (Protonix) 40 MG tablet every 24 hours   . ondansetron (Zofran ODT) 4 MG disintegrating tablet Take 1 tablet (4 mg total) by mouth every 6 (six) hours as needed for Nausea        FAMILY HISTORY: family history includes Diabetes in her maternal grandfather; Heart attack in her maternal grandfather; Heart disease in her maternal grandfather; Heart failure in her maternal grandfather; Hypertension in her mother; Other in her  mother; Supraventricular tachycardia in an other family member.    SOCIAL HISTORY: She reports that she has never smoked. She has never used smokeless tobacco. She reports current alcohol use. She reports that she does not use drugs.    PHYSICAL EXAMINATION    Visit Vitals  BP 100/62   Pulse 86   Ht 1.575 m (5\' 2" )   Wt 51.3 kg (113 lb)   BMI 20.67 kg/m       Constitutional:  Alert, cooperative, no acute distress  Integumentary:  Warm and dry to touch  Head:  Normocephalic  Neck:  Carotid pulses full and equal bilaterally, no bruits, no JVD  Chest:  Clear to auscultation bilaterally, no use of accessory muscles, normal  respiratory effort  Cardiac:  Regular rate and rhythm, S1 and S2 normal, no S3 or S4, without murmurs, midsystolic click  Abdomen:  Soft, non-tender, non-distended  Peripheral Pulses:  2+ radial pulses bilaterally  Extremities and Back:  No edema present  Neurologic:  Affect appropriate  Psychiatric:  Normal memory      ECG: Sinus rhythm with heart rate 86 bpm      Most recent cardiology progress note was reviewed.      IMPRESSION:   [name] is a 30 y.o. female with the following problems:    1. Recent ER evaluation for episode of palpitations and near syncope, with unremarkable work-up.  Started on low-dose Toprol, with ongoing palpitations and lightheadedness above her baseline.  2. PSVT, with abrupt brief episodes that respond to vagal maneuvers.  14-day CAM in September 2021 showed average HR 78 bpm (range 43-195 bpm), no sustained arrhythmias, 3 nonsustained episodes of PSVT correlating to patient-triggered symptoms (lasting 6-15 seconds with HR ranging 150-195 bpm, longest episode 15 seconds with average HR 171 bpm), trivial benign ectopy < 1% burden, no atrial fibrillation/flutter, and no bradyarrhythmias.  3. 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  Trace MR by most recent echo in August 2021.  4. Preserved LVEF.  5. Atypical chest pains that are nonexertional and somewhat sharp and brief in quality, likely noncoronary and may be noncardiac although may be a part of mitral valve prolapse syndrome.  6. Family history of mitral valve prolapse in both her mother and her maternal grandmother.  7. Strong family history of premature atherosclerotic heart disease and numerous second and third-degree relatives including grandparents and great uncles on the  mother side and a paternal aunt on the father's side.  8. Exercise treadmill stress test 11/10/19 negative for ischemia.  Noted exercise-induced arrhythmias, although noted frequent PVCs.  9. Recent abnormal thyroid tests.  10. Chronically positive ANA.      RECOMMENDATIONS:    1. She will wear the MCT monitor for the full 30 days.  I told her to trigger the monitor if she has any symptoms so that we can see if there is any correlation to underlying arrhythmias.  Dr. Eliseo Gum will be in touch with her after he has reviewed the data.  2. Increase Toprol to 25 mg daily to see if this helps with her ongoing palpitations.  3. No further cardiac testing at this time.  4. Barring any significant abnormalities on her MCT, we will plan for an office follow-up with Dr. Eliseo Gum in 6  to 12 months.  5. Close follow-up with her PCP regarding her recently abnormal thyroid tests.  She also has follow-up with a rheumatologist in the coming weeks.                                                     Orders Placed This Encounter   Procedures   . ECG 12 lead (Normal)       Orders Placed This Encounter   Medications   . metoprolol succinate XL (TOPROL-XL) 25 MG 24 hr tablet     Sig: Take 1 tablet (25 mg total) by mouth daily     Dispense:  90 tablet     Refill:  3       SIGNED:    Lyn Hollingshead, NP          This note was generated by the Dragon speech recognition and may contain errors or omissions not intended by the user. Grammatical errors, random word insertions, deletions, pronoun errors, and incomplete sentences are occasional consequences of this technology due to software limitations. Not all errors are caught or corrected. If there are questions or concerns about the content of this note or information contained within the body of this dictation, they should be addressed directly with the author for clarification.

## 2020-05-01 ENCOUNTER — Other Ambulatory Visit (FREE_STANDING_LABORATORY_FACILITY): Payer: No Typology Code available for payment source

## 2020-05-01 DIAGNOSIS — R197 Diarrhea, unspecified: Secondary | ICD-10-CM

## 2020-05-01 LAB — T3, FREE: T3, Free: 2.64 pg/mL (ref 1.71–3.71)

## 2020-05-01 LAB — TSH: TSH: 1.99 u[IU]/mL (ref 0.35–4.94)

## 2020-05-01 LAB — T4, FREE: T4 Free: 1.08 ng/dL (ref 0.70–1.48)

## 2020-05-23 ENCOUNTER — Encounter (INDEPENDENT_AMBULATORY_CARE_PROVIDER_SITE_OTHER): Payer: Self-pay

## 2020-05-23 ENCOUNTER — Telehealth (INDEPENDENT_AMBULATORY_CARE_PROVIDER_SITE_OTHER): Payer: Self-pay

## 2020-05-23 NOTE — Consults (Signed)
[facility]  [address] [place], [place] [address]     [name]    Date of Visit:  04/10/2017  Date of Birth: [birth year]  Age: 30 yrs.   Medical Record Number: [id number]  Referring Physician: Sedonia Small MD, Maurico Perrell  __   CURRENT DIAGNOSES     1. Palpitations, R00.2  __  ALLERGIES     Azithromycin, Intolerance-unknown  __  MEDICATIONS     1. multivitamin capsule, 1po qd   __  CHIEF COMPLAINT/REASON FOR VISIT  Consult for Palpitations  __  HISTORY OF PRESENT ILLNESS   Carol Sherman is a very pleasant 30 year old female. She has a known history of mitral valve prolapse with trivial regurgitation and inappropriate sinus tachycardia. She was followed by a cardiologist in [place], [place], and has since moved to [place] and is currently an employee at [employer]. She states that she has been doing quite well for the last year-and-a-half overall. She has been on Corlanor and metoprolol in the past. She states that it seemed to work and then she felt better  and since then it has been discontinued and she has been doing fine. However, she states that over the last six months she has had increased palpitations that lead to fatigue. The symptoms last for up to 30 seconds during the daytime but at nighttime  it wakes her up and lasts up to 15 minutes. The fatigue sometimes becomes concerning enough that it may last all day. However, it has not been worse over the last six months, but it has been a steady problem for her. She has been followed by general cardiologists  for mitral valve regurgitation in the past, which has, again, just been trivial and other than that her only other complaint is the leg pain in her right leg that comes and goes at times. She states she has a varicosity in her right leg and states at  rest there is some mild pain that is described as a burning sensation all along the lateral aspect of the right leg. She states that she sits a lot at work and  stands as well and denies any swelling in her lower extremities. She has had no recent transatlantic  flights, or no recent car rides that were more than four hours.   __  PAST HISTORY     Past  Medical Illnesses: No previous history of significant medical illnesses.;  Past Cardiac Illnesses : Mitral regurgitation, Inappropriate Sinus Tach, Heart Murmur; Infectious Diseases: No previous history of significant infectious diseases.;  Surgical Procedures: Broken tibia/ fibula 2001, wisdom teeth extraction; Trauma History: No previous history  of significant trauma.; Cardiology Procedures-Noninvasive: Abdominal Aorta Ultrasound 04/10/15; Left  Ventricular Ejection Fraction: LVEF of 65% documented via echocardiogram on 04/10/2015  ___   FAMILY HISTORY  MaternalAunt -- None (SVT)  MaternalGrandparent  -- Congestive heart failure  Mother -- Mitral valve regurgitation     __  CARDIAC RISK FACTORS     Tobacco Abuse: has never used tobacco;  Family History of Heart Disease: positive; Hyperlipidemia: negative;  Hypertension: negative;  Diabetes Mellitus: negative;  Prior History of Heart Disease: positive; Obesity: negative;  Sedentary Life Style:negative; BMW:[id number]; Menopausal :negative  __  SOCIAL HISTORY    Alcohol Use : drinks occasionally and mixed drinks; Smoking: never smoked; Never smoker ([id number]); Diet : Regular diet; Exercise: Exercises occasionally;   __  PHYSICAL EXAMINATION     Vital Signs:  Blood Pressure:  112/74  Sitting, Left arm, regular cuff  110/72 Sitting, Right arm,  regular cuff    Weight: 112.00 lbs.  Height: 62"   BMI: 20   Pulse: 84/min. ekg        Constitutional: Cooperative, alert and oriented,well developed, well nourished, in no acute distress. Skin:  Warm and dry to touch, no apparent skin lesions, or masses noted. Head: Normocephalic, normal hair pattern,  no masses or tenderness Eyes: EOMS Intact, PERRL, conjunctivae and lids normal. Funduscopic exam and visual fields not performed.  ENT:  Ears, Nose and throat reveal no gross abnormalities. No pallor or cyanosis. Dentition good. Neck : No palpable masses or adenopathy, no thyromegaly, no JVD, carotid pulses are full and equal bilaterally without bruits. Chest : Normal symmetry, no tenderness to palpation, normal respiratory excursion, no intercostal retraction, no use of accessory muscles, normal diaphragmatic excursion, clear to auscultation and percussion.  Cardiac: Regular rhythm, S1 normal, S2 normal, No S3 or S4, Apical impulse not displaced, no murmurs, gallops or rubs detected. Abdomen : Abdomen soft, bowel sounds normoactive, no masses, no hepatosplenomegaly, non-tender, no bruits Peripheral Pulses:  The femoral, popliteal, dorsalis pedis, and posterior tibial pulses are full and equal bilaterally with no bruits auscultated. Extremities/Back: No deformities,  clubbing, cyanosis, erythema or edema observed. There are no spinal abnormalities noted. Normal muscle strength and tone. Psychiatric:  oriented to time, place, and person Neurological: No gross motor or sensory deficits noted, affect appropriate,  oriented to time, person and place.   __    Medications added today by the physician:       IMPRESSIONS:  1. Inappropriate sinus tachycardia history treated by cardiologist in [place], [place]. She  was on Corlanor 2.5 mg by mouth twice per day in the past, and metoprolol; however, these   have both been discontinued since 2017.   2. History of palpitations leading to fatigue that occur and last up to 15 minutes at night when  she is sleeping.   3. Mitral valve regurgitation, trivial, history of mitral valve prolapse with anterior  and posterior  leaflets.   4. Leg pain, right leg.     RECOMMENDATIONS:  1. The patient is overall doing quite well from an inappropriate  tachycardia point of view. She  has been off of all of her medicines and states that she feels fine and does not want to go  back on Corlanor as it caused some mild chest  discomfort to her. Nevertheless, we talked  about the other palpitations  she is having which occur during the daytime and nighttime hours  lasting up to 15 minutes at times overnight and causing fatigue. I recommended a monitor to be  worn for this, but for now she would like to wait on that. In the interim, regarding  treatment  for both of these problems, I suspect dehydration could be playing a role and we also talked  about electrolyte management as well. I have asked her to try drinking coconut water  30 minutes before her exercise and perhaps a full  bottle throughout the day along with taking  a magnesium supplement up to 400 mg a day to try and suppress palpitations as well and  she prefers to take this route instead of trying other prescription medicines.   2. Regarding her leg pain,  I suspect this is maybe just due to her varicosity although I did not  see any or appreciate any on my examination today. She will use compression socks and  try  and keep her right leg elevated when she is sitting at work. If the leg pain is getting  worse, I  suspect she may need a lower extremity Doppler to evaluate for venous insufficiency as well.   3. She will let me know if she has any worsening symptoms. In the interim she will see me  back in the office in four months.      Jaliel Deavers M. Sedonia Small, MD, [facility], [facility]    CMS/tuarg     MG   ____________________________  TODAYS ORDERS  12 Lead ECG Today  EP  Return Visit 15 minutes 4 months

## 2020-05-23 NOTE — Telephone Encounter (Addendum)
Received call from [facility]'s desire to start Pt on plaquenil and needing cardiac clearance.  Stated they previously faxed request but it was not found by [facility] staff.  Requested they resend.    Received fax request that mentioned labs but those were not included. Called them back to have them fax over the labs to include with the request. They stated that the most recent labs were from late March. I asked them to forward them to [phone].    Received the labs at 1530 and placed all documentation into Dr. Bufford Spikes file folder for review.

## 2020-05-23 NOTE — Progress Notes (Signed)
[facility]  [address] [place], [place] [address]     Carol Sherman    Date of Visit:  02/26/2018  Date of Birth: September 15, 1990  Age: 30 yrs.   Medical Record Number: [id number]  __  CURRENT DIAGNOSES     1. Palpitations, R00.2  __   ALLERGIES    Azithromycin, Intolerance-unknown  __   MEDICATIONS     1. multivitamin capsule, 1po qd  __  CHIEF COMPLAINT/REASON FOR VISIT  Followup  of Palpitations  __  HISTORY OF PRESENT ILLNESS    Ms. Carol Sherman is here for follow-up regarding tachycardia. She has a known history of mitral  valve prolapse with trivial regurgitation and inappropriate sinus tachycardia. She is to follow a cardiologist in [place] and has since relocated to [place] and is establishing care with [place] here. She is employed a Astronomer. She  is been doing quite well overall she was on Corlanor and metoprolol in the past for tachycardia that was inappropriately fast. She has since been off these medications. She was at her parents house over the holidays and had a 1 minute episode of tachycardia  that resolved on its own. She is not had any episodes since then and she states her quality life is otherwise intact. She is active and denies any shortness of breath no lightheadedness no dizziness.  __   PAST HISTORY     Past Medical Illnesses: No previous history of significant medical illnesses.;   Past Cardiac Illnesses: Mitral regurgitation, Inappropriate Sinus Tach, Heart Murmur; Infectious Diseases : No previous history of significant infectious diseases.; Surgical Procedures: Broken tibia/ fibula 2001, wisdom teeth extraction;  Trauma History: No previous history of significant trauma.; Cardiology Procedures-Noninvasive: Abdominal  Aorta Ultrasound 04/10/15; Left Ventricular Ejection Fraction: LVEF of 65% documented via echocardiogram on 04/10/2015   ___  FAMILY HISTORY  MaternalAunt -- None (SVT)  MaternalGrandparent --  Congestive heart failure  Mother --  Mitral valve regurgitation    __  CARDIAC RISK FACTORS      Tobacco Abuse: has never used tobacco; Family History of Heart Disease: positive;  Hyperlipidemia: negative; Hypertension: negative;   Diabetes Mellitus: negative; Prior History of Heart Disease: positive;  Obesity: negative; Sedentary Life Style:negative;  GLO:VFIEPPIR; Menopausal:negative  __   SOCIAL HISTORY    Alcohol Use: drinks occasionally and mixed drinks;  Smoking: never smoked; Never smoker ([id number]); Diet: Regular diet;  Exercise: Exercises occasionally;   __  PHYSICAL EXAMINATION    Vital Signs:   Blood Pressure:  108/60 Sitting, Right arm, regular cuff    Weight: 111.00 lbs.   Height: 62.00"  BMI: 20.30   Pulse:  70/min. Apical Regular   Respirations: 16/min.       Constitutional:  Cooperative, alert and oriented,well developed, well nourished, in no acute distress. Skin: Warm and dry to touch, no apparent skin lesions, or masses  noted. Head: Normocephalic, normal hair pattern, no masses or tenderness Eyes : EOMS Intact, PERRL, conjunctivae and lids normal. Funduscopic exam and visual fields not performed. ENT: Ears, Nose and throat reveal no gross abnormalities.  No pallor or cyanosis. Dentition good. Neck: No palpable masses or adenopathy, no thyromegaly, no JVD, carotid pulses are full and equal bilaterally  without bruits. Chest: Normal symmetry, no tenderness to palpation, normal respiratory excursion, no intercostal retraction, no use of accessory muscles,  normal diaphragmatic excursion, clear to auscultation and percussion. Cardiac: Regular rhythm, S1 normal, S2 normal, No S3 or S4, Apical  impulse not  displaced, no murmurs, gallops or rubs detected. Abdomen: Abdomen soft, bowel sounds normoactive, no masses, no hepatosplenomegaly, non-tender, no bruits  Peripheral Pulses: The femoral, popliteal, dorsalis pedis, and posterior tibial pulses are full and equal bilaterally with no bruits auscultated. Extremities/Back : No deformities,  clubbing, cyanosis, erythema or edema observed. There are no spinal abnormalities noted. Normal muscle strength and tone. Psychiatric : oriented to time, place, and person Neurological: No gross motor or sensory deficits noted, affect appropriate, oriented to time, person and place.    __    Medications added today by the physician:      IMPRESSIONS:  1. Inappropriate sinus tachycardia treated with core Lenore and metoprolol in the past and both been discontinued since 2017, this was treated by cardiologist in [facility]  2. History of palpitations leading to fatigue that lasts up to 15 minutes  3. Trivial mitral valve regurgitation with history of mitral valve prolapse with anterior and posterior leaflets based on echocardiogram in 2017    RECOMMENDATIONS:   Ms Carol Sherman is doing quite well overall, she is having less events in terms of palpitations. We discussed watchful waiting for now and no changes to be made to her medical management. I have asked her to stay well-hydrated with staying active in terms  electrolyte repletion especially if she is going to have increased exercise for that particular day is essential. As she is only having a very short event of palpitations lasting about a minute, no changes are to be made to her medical therapy today.  I did not appreciate any murmurs on exam thus no indication for echocardiogram presently as well. I will see her back in the office in 1 year.      This note was generated by the [facility] EMR system/Dragon speech recognition and may contain errors  or omissions not intended by the user. Grammatical errors, random word insertions, deletions, pronoun errors, and incomplete sentences are occasional consequences of this technology due to software limitations. Not all errors are caught or corrected.  If there are questions or concerns about the content of this note or information contained within the body of this dictation, they should be addressed directly with the  author for clarification.      Terrian Ridlon M. Sedonia Small, MD, [facility]       cc:   ____________________________  Christianne Dolin  EP Return Visit 15 minutes 6 months

## 2020-05-28 ENCOUNTER — Encounter (INDEPENDENT_AMBULATORY_CARE_PROVIDER_SITE_OTHER): Payer: Self-pay | Admitting: Cardiovascular Disease

## 2020-05-28 ENCOUNTER — Other Ambulatory Visit (INDEPENDENT_AMBULATORY_CARE_PROVIDER_SITE_OTHER): Payer: Self-pay | Admitting: Cardiovascular Disease

## 2020-05-28 DIAGNOSIS — R002 Palpitations: Secondary | ICD-10-CM

## 2020-05-28 NOTE — Progress Notes (Signed)
I received a note from her rheumatologist, Dr. Cyndie Chime, regarding starting Plaquenil therapy.  There are no cardiac contraindications to this from our standpoint.  She should have an echocardiogram 2 weeks after initiation and can do that in our office.  Her baseline QT interval was normal.      Carol Sherman. Eliseo Gum, MD, [facility]  [facility]

## 2020-06-11 ENCOUNTER — Encounter (INDEPENDENT_AMBULATORY_CARE_PROVIDER_SITE_OTHER): Payer: No Typology Code available for payment source | Admitting: Cardiovascular Disease

## 2020-06-11 ENCOUNTER — Encounter (INDEPENDENT_AMBULATORY_CARE_PROVIDER_SITE_OTHER): Payer: Self-pay | Admitting: Cardiovascular Disease

## 2020-06-11 DIAGNOSIS — R55 Syncope and collapse: Secondary | ICD-10-CM

## 2020-06-11 NOTE — Progress Notes (Signed)
CARDIAC AMBULATORY ECG MONITOR REPORT    [facility] OFFICE -CARDIOLOGY  [address]  [place] [address]  Dept: [phone]  Dept Fax: [phone]    NAME: Carol Sherman    SEX: female  DOB: 1990/05/11 (30 y.o.)   MRN: [id number]   REFERRING PHYSICIAN: Cristela Felt, MD          INDICATION: Syncope    INTERPRETATION DATE: 06/11/2020     This is a 30-day mobile cardiac telemetry continuous ECG monitor worn between March 7 and May 22, 2020.    FINDINGS:   1. The average heart rate was 74 bpm, ranging 45-168 bpm.  2. There were no sustained atrial or ventricular tachyarrhythmias.  3. There was no atrial fibrillation or atrial flutter.  There was a brief ~2 minute episode of 4-5:1 conducted atrial tachycardia at a heart rate of 60-70 bpm on March 21 around 6am, that was read by the machine as atrial flutter but appears more consistent with atrial tachycardia.  4. There were no bradyarrhythmias, episodes of heart block, or ventricular pauses.  5. There was a trivial frequency of benign ectopy.  6. The patient reported symptoms on 13 occasions, including chest pain/pressure, shortness of breath, fluttering/skipped beats, rapid/fast heartbeats, and light-headedness, which largely correlated with normal sinus rhythm at normal heart rates.  A single episode of nonsustained SVT (13 seconds) on March 19 around 1:41pm did correlate with her rapid/fast heartbeat and lightheadedness symptoms she recorded around that time.    IMPRESSIONS: Reassuring 30-day continuous ambulatory ECG monitoring device demonstrating no sustained tachyarrhythmias, specifically no ventricular arrhythmias, and a low frequency of benign ectopy.    RECOMMENDATIONS: Reassurance.  These results will be given to the patient.      The tracings for the above mentioned monitor can be requested by calling [facility] at [phone], ext. [phone].    Elliot Dally. Eliseo Gum, MD, [facility]  [place]

## 2020-06-14 ENCOUNTER — Other Ambulatory Visit (INDEPENDENT_AMBULATORY_CARE_PROVIDER_SITE_OTHER): Payer: Self-pay | Admitting: Cardiovascular Disease

## 2020-06-14 DIAGNOSIS — I341 Nonrheumatic mitral (valve) prolapse: Secondary | ICD-10-CM

## 2020-08-14 ENCOUNTER — Other Ambulatory Visit (FREE_STANDING_LABORATORY_FACILITY): Payer: No Typology Code available for payment source

## 2020-08-14 DIAGNOSIS — R5381 Other malaise: Secondary | ICD-10-CM

## 2020-08-14 DIAGNOSIS — E559 Vitamin D deficiency, unspecified: Secondary | ICD-10-CM

## 2020-08-14 LAB — VITAMIN D,25 OH,TOTAL: Vitamin D, 25 OH, Total: 42 ng/mL (ref 30–100)

## 2020-08-14 LAB — TSH: TSH: 1.95 u[IU]/mL (ref 0.35–4.94)

## 2020-08-14 LAB — T4, FREE: T4 Free: 0.99 ng/dL (ref 0.70–1.48)

## 2020-08-15 ENCOUNTER — Other Ambulatory Visit: Payer: Self-pay | Admitting: Internal Medicine

## 2020-08-15 DIAGNOSIS — R221 Localized swelling, mass and lump, neck: Secondary | ICD-10-CM

## 2020-08-31 ENCOUNTER — Ambulatory Visit: Payer: No Typology Code available for payment source | Attending: Internal Medicine

## 2020-08-31 DIAGNOSIS — R221 Localized swelling, mass and lump, neck: Secondary | ICD-10-CM | POA: Insufficient documentation

## 2020-08-31 MED ORDER — IOHEXOL 350 MG/ML IV SOLN
75.0000 mL | Freq: Once | INTRAVENOUS | Status: AC | PRN
Start: 2020-08-31 — End: 2020-08-31
  Administered 2020-08-31: 75 mL via INTRAVENOUS

## 2020-09-26 ENCOUNTER — Inpatient Hospital Stay
Payer: No Typology Code available for payment source | Attending: Neurological Surgery | Admitting: Rehabilitative and Restorative Service Providers"

## 2020-09-26 ENCOUNTER — Encounter: Payer: Self-pay | Admitting: Rehabilitative and Restorative Service Providers"

## 2020-09-26 VITALS — BP 117/74 | HR 88

## 2020-09-26 DIAGNOSIS — M79671 Pain in right foot: Secondary | ICD-10-CM | POA: Insufficient documentation

## 2020-09-26 NOTE — Progress Notes (Signed)
INITIAL EVALUATION (Ankle/Foot)  Name:Carol Sherman Age: 30 y.o.   Date of Service: 09/26/2020  Referring Physician: Riccardo Dubin, [place]   Date of Injury: 08/05/2020  Date Care Plan Established/Reviewed: 09/26/2020  Date Treatment Started: 09/26/2020  End of Certification Date: 12/24/2020  Sessions in Plan of Care: 18  Surgery Date: No data was found  MD Follow-up: No data was found  Medbridge Code: [email or web address]    Visit Count: 1   Diagnosis:   1. Right foot pain        Subjective     History of Present Illness   History of Present Illness: Patient presents to therapy due to R foot pain that started after fall while hiking on 08/05/2020 (Father's Day Weekend) (pt recalls her foot went in awkward way and felt a pop). Saw Dr. [facility] - was diagnosed with 5th metatarsal tuberosity fracture. Most recent follow up with ortho was on 09/25/20 - provided referral to physical therapy.     Pain isn't too bad if she's not putting weight on it but still has discomfort when she's walking (walks ~1 mile). Also reports that she     Doesn't feel like the pain is bad enough to take pain medication.  Functional Limitations (PLOF): Discomfort in foot after walking ~1/2 mile (no difficulty hiking)  Difficulty/some discomfort with stairs (less difficulty)  More caution when walking barefoot (less difficulty)    Outcome Measure   Tool Used/Details: FOTO  Score: 52  Predicted Functional Outcome: 75    Pain   Current pain rating: 2  At best pain rating: 0  At worst pain rating: 6    Social Support/Occupation    Lives in: multiple level home    Lives with: spouse    Occupation: works at [employer]    Precautions: No data was found  Allergies: Azithromycin    Past Medical History:   Diagnosis Date    Abdominal Aorta Ultrasound 04/10/2015    Heart murmur     Inappropriate sinus tachycardia     Irregular heart beat     Mitral regurgitation     Mitral valve abscess        Objective              OBJECTIVE:    Vitals: BP: 117/74 Heart Rate: 88       Observation/Posture/Gait/Integumentary:  Observation of posture: Deficits noted: Other: decreased WB no R  Ambulation: without AD   Gait: decreased heel-toe gait pattern, limited toe off, decreased WB on lateral aspect of foot  Integumentary: No wound, lesion or rash noted  Palpation: Pain to palpation: along lateral aspect of foot (esp along lateral 3 digits)  Joint Mobility Assessment: hypomobile TCJ posterior End feel: Firm    Girth/Edema: Other: slight increase in swelling on R but minimal   Initial R Initial L   Bimalleolar     Figure 8     Midfoot     Forefoot     (blank fields were intentionally left blank)    Range of Motion: (degrees)  Initial  Right  AROM Initial  Right PROM   Right AROM   Right PROM Ankle Initial  Left AROM Initial  Left PROM   Left AROM   Left PROM   3 knee ext    Dorsiflexion 4 knee straight      60    Plantarflexion 31      40    Inversion 36      10  Eversion 8          Knee Flexion           Knee Extension       (blank fields were intentionally left blank)  1st toe flex:  R: 15  L: 9  1st toe ext:  R: 10  L: 50    Hip AROM: WFL  Knee AROM: WFL    Strength:   Initial  R   R LE Strength  MMT /5 Initial  L   L    4-  Ankle Dorsiflexion 5    4-  Ankle Plantarflexion 5    3+  Ankle Inversion 5    3+  Ankle Eversion 5-    4+  Quadriceps 4+    4  Hamstrings 4+    3+  Hip Abduction     (blank fields were intentionally left blank)  Hip Flex:  R: 4  L: 4    Flexibility:    Comment:   Hamstrings NT    Quadriceps NT    Piriformis NT    ITBand NT    Iliopsoas NT    Gastroc Restricted Bilateral      Functional Strength:   Sit to Stand: Independent -   Squat:  NT  Step-up: NT  Heel Raises:  Bilateral: able but tight along plantar/dorsal aspect of foot on lateral aspect    Unilateral: R: NT L: NT    Balance:  SLS R: Eyes Open (EO): 2 sec. Eyes Closed (EC): NT sec.  SLS L: Eyes Open (EO): >30  sec. Eyes Closed (EC): NT sec.    Special Tests/Neurological Screen:   R L  R L   Anterior Drawer NT  NT Windlass NT NT   Posterior Drawer NT NT Navicular Drop NT NT   Talar Tilt NT NT Thompsons NT NT   Valgus Stress NT NT  NT NT   Varus Stress NT NT        Deep Tendon Reflex R L   L3-L4 Patella NT NT   S1 Achilles NT NT    NT NT     Sensation to Light touch: Intact    BP: 117/74 Heart Rate: 88    Treatment     Therapeutic Exercises - Justified to address any of the following:  To develop strength, endurance, ROM and/or flexibility.   Exercises  Foot Roller Plantar Massage - 2 x daily - 7 x weekly - 3 sets - 10 reps  Soleus Stretch on Wall - 2 x daily - 7 x weekly - 3 sets - 15 hold  Gastroc Stretch on Wall - 2 x daily - 7 x weekly - 3 sets - 15 hold  Seated Heel Raise - 2 x daily - 7 x weekly - 1 sets - 10 reps  Seated Heel Toe Raises - 2 x daily - 7 x weekly - 1 sets - 10 reps    *Patient instructed on proper completion of HEP, expected results, POC, and purpose of selected exercises to promote optimal outcomes.      Manual Therapy - Justified to address any of the following:    Mobilization of joints and soft tissues, manipulation, manual lymphatic drainage, and/or manual traction.    Long Sitting: STM to plantar surface of foot; Gr I-II joint mobs to TCJ and MT joints    Home Exercises   Access Code: [email or web address]  URL: [email or web address]  Date: 09/26/2020  Prepared by: Mauri Brooklyn       ---  Flowsheet Row ---   Total Time    Timed Minutes 18 minutes   Untimed Minutes 20 minutes   Total Time 38 minutes          Assessment   Carol Sherman is a 30 y.o. female presenting with R foot pain who requires Physical Therapy for the following:  Impairments: Decreased ankle/toe ROM, decreased foot/LE strength, decreased muscle extensibility, impaired balance, postural deficits, abnormal gait kinematics, and increased pain.      Pain located: R foot    Clinical presentation: stable - MSK pattern of pain  Barriers to therapy: Patient's level of motivation - plans to return to hiking - may increase risk of  recurrence  Functional Limitations (PLOF): Discomfort in foot after walking ~1/2 mile (no difficulty hiking)  Difficulty/some discomfort with stairs (less difficulty)  More caution when walking barefoot (less difficulty)  Prognosis: good  Patient is aware of diagnosis, prognosis and consents to plan of care: Yes  Plan   Visits per week: 2  Number of Sessions: 18  Direct One on One  16109: Therapeutic Exercise: To Develop Strength and Endurance, ROM and Flexibility  [id number]: Gait Training  60454: Neuromuscular Reeducation  97140: Manual Therapy techniques (mobilization, manipulation, manual traction) (STM to foot/lower leg muscles; Gr I-V joint mobs as needed)  97530: Therapeutic Activities: Dynamic activities to improve functional performance  Dry Needling  Supervised Modalities  97010: Thermal modalities: hot/cold packs  Initiate POC to improve pain, ROM, strength and balance.    Goals      Goal 1: Increase ankle strength >5-/5 so patient will be able to ambulate without assistive device on level and uneven ground 60 minutes in community.   Sessions: 18      Goal 2: Improve single leg balance to >30 seconds so patient can negotiate level & uneven surfaces safely with no loss of balance.  8/15: Progressing- Patient demonstrates improved tolerance to weight on R LE but challenged to maintain balance beyond 10 sec. AC   Sessions: 18      Goal 3: Patient will demonstrate independence in prescribed HEP with proper form, sets and reps for safe discharge to an independent program.  8/15: Progressing- Patient continues to perform HEP. AC         Sessions: 18      Goal 4: Patient will increase FOTO score from 52 to > 75 to demonstrate improved overall function with ADLs.       Sessions: 18          Goal 5: Increase hip abd strength >4+/5 so patient can negotiate 1 flight(s) of stairs reciprocally without increased pain >3/10.   Sessions: 67                           Roetta Sessions, DPT

## 2020-09-27 ENCOUNTER — Inpatient Hospital Stay
Payer: No Typology Code available for payment source | Attending: Neurological Surgery | Admitting: Rehabilitative and Restorative Service Providers"

## 2020-09-27 DIAGNOSIS — M79671 Pain in right foot: Secondary | ICD-10-CM | POA: Insufficient documentation

## 2020-09-27 NOTE — PT/OT Therapy Note (Signed)
Name: Nolah Krenzer Santizo Age: 30 y.o.   Date of Service: 09/27/2020  Referring Physician: Riccardo Dubin, [place]   Date of Injury: 08/05/2020  Date Care Plan Established/Reviewed: 09/26/2020  Date Treatment Started: 09/26/2020  End of Certification Date: 12/24/2020  Sessions in Plan of Care: 18  Surgery Date: No data was found  MD Follow-up: No data was found  Medbridge Code: [email or web address]    Visit Count: 2   Diagnosis:   1. Right foot pain        Subjective     Daily Subjective   A little sore after IE but not too bad.    Social Support/Occupation    Lives in: multiple level home    Lives with: spouse    Occupation: works at [employer]    Precautions: No data was found  Allergies: Azithromycin    Objective            Initial Evaluation Reference and/or Current Measurements (as dated):    Range of Motion: (degrees)  Initial  Right  AROM Initial  Right PROM    Right AROM    Right PROM Ankle Initial  Left AROM Initial  Left PROM    Left AROM    Left PROM   3 knee ext       Dorsiflexion 4 knee straight         60       Plantarflexion 31         40       Inversion 36         10       Eversion 8                 Knee Flexion                   Knee Extension           (blank fields were intentionally left blank)  1st toe flex:  R: 15  L: 9  1st toe ext:  R: 10  L: 50     Hip AROM: WFL  Knee AROM: WFL     Strength:   Initial  R    R LE Strength  MMT /5 Initial  L    L    4-   Ankle Dorsiflexion 5     4-   Ankle Plantarflexion 5     3+   Ankle Inversion 5     3+   Ankle Eversion 5-     4+   Quadriceps 4+     4   Hamstrings 4+     3+   Hip Abduction       (blank fields were intentionally left blank)  Hip Flex:  R: 4  L: 4         Treatment     Therapeutic Exercises - Justified to address any of the following:  To develop strength, endurance, ROM and/or flexibility.   Manual stretching for R ankle/digit    - Reviewed Gastroc Stretch at Wall, 30 sec x 2 R only  - Reviewed Soleus/Plantar Stretch at Wall, 30 sec x 2 R only  - Introduced Toe  Yoga, ~10 reps (therapist providing demo and verbal cuing)  - Introduced Horticulturist, commercial, ~10 reps with 5 sec holds  - Instructed in Seated Toe/Heel Raises, ~20 reps each way  - Introduced Manual Stretching for Toe Flex/Ext ROM    Foot Roller Plantar Massage - 2 x daily -  7 x weekly - 3 sets - 10 reps  Soleus Stretch on Wall - 2 x daily - 7 x weekly - 3 sets - 15 hold  Gastroc Stretch on Wall - 2 x daily - 7 x weekly - 3 sets - 15 hold  Seated Heel Raise - 2 x daily - 7 x weekly - 1 sets - 10 reps  Seated Heel Toe Raises - 2 x daily - 7 x weekly - 1 sets - 10 reps     *Therapist providing verbal/tactile cuing to improve from and technique with exercises above.  *Reviewed updated HEP    Manual Therapy - Justified to address any of the following:    Mobilization of joints and soft tissues, manipulation, manual lymphatic drainage, and/or manual traction.    Long Sitting: STM to plantar aspect of foot with/without Graston tool; Gr I-II MT, TCJ joint mobs    Home Exercises   Access Code: [email or web address]  URL: [email or web address]  Date: 09/26/2020  Prepared by: Mauri Brooklyn     ---      Flowsheet Row ---   Total Time    Timed Minutes 48 minutes   Total Time 48 minutes          Assessment   Patient demonstrating less restriction along plantar surface of R foot after MT with improved toe mobility by end of session. Patient adequately challenged by addition of ankle/foot intrinsic mobility exercises but continues to require therapy to improve strength, balance and overall function.  Patient is aware of diagnosis, prognosis and consents to plan of care: Yes  Plan   Progress POC to improve pain, ROM, strength and overall function.    Goals      Goal 1: Increase ankle strength >5-/5 so patient will be able to ambulate without assistive device on level and uneven ground 60 minutes in community.   Sessions: 18      Goal 2: Improve single leg balance to >30 seconds so patient can negotiate level & uneven surfaces safely with  no loss of balance.  8/15: Progressing- Patient demonstrates improved tolerance to weight on R LE but challenged to maintain balance beyond 10 sec. AC   Sessions: 18      Goal 3: Patient will demonstrate independence in prescribed HEP with proper form, sets and reps for safe discharge to an independent program.  8/15: Progressing- Patient continues to perform HEP. AC         Sessions: 18      Goal 4: Patient will increase FOTO score from 52 to > 75 to demonstrate improved overall function with ADLs.       Sessions: 18          Goal 5: Increase hip abd strength >4+/5 so patient can negotiate 1 flight(s) of stairs reciprocally without increased pain >3/10.   Sessions: 1                           Roetta Sessions, DPT

## 2020-10-01 ENCOUNTER — Inpatient Hospital Stay
Payer: No Typology Code available for payment source | Attending: Neurological Surgery | Admitting: Rehabilitative and Restorative Service Providers"

## 2020-10-01 DIAGNOSIS — M79671 Pain in right foot: Secondary | ICD-10-CM | POA: Insufficient documentation

## 2020-10-01 NOTE — PT/OT Therapy Note (Signed)
Name: Carol Sherman Age: 30 y.o.   Date of Service: 10/01/2020  Referring Physician: Riccardo Dubin, [place]   Date of Injury: 08/05/2020  Date Care Plan Established/Reviewed: 09/26/2020  Date Treatment Started: 09/26/2020  End of Certification Date: 12/24/2020  Sessions in Plan of Care: 18  Surgery Date: No data was found  MD Follow-up: No data was found  Medbridge Code: [email or web address]    Visit Count: 3   Diagnosis:   1. Right foot pain        Subjective     Daily Subjective   Patient reports there's a night and day difference between this week and last week.    Social Support/Occupation    Lives in: multiple level home    Lives with: spouse    Occupation: works at [employer]    Precautions: No data was found  Allergies: Azithromycin    Objective            Initial Evaluation Reference and/or Current Measurements (as dated):    Range of Motion: (degrees)  Initial  Right  AROM Initial  Right PROM    Right AROM    Right PROM Ankle Initial  Left AROM Initial  Left PROM    Left AROM    Left PROM   3 knee ext       Dorsiflexion 4 knee straight         60       Plantarflexion 31         40       Inversion 36         10       Eversion 8                 Knee Flexion                   Knee Extension           (blank fields were intentionally left blank)  1st toe flex:  R: 15  L: 9  1st toe ext:  R: 10  L: 50     Hip AROM: WFL  Knee AROM: WFL     Strength:   Initial  R    R LE Strength  MMT /5 Initial  L    L    4-   Ankle Dorsiflexion 5     4-   Ankle Plantarflexion 5     3+   Ankle Inversion 5     3+   Ankle Eversion 5-     4+   Quadriceps 4+     4   Hamstrings 4+     3+   Hip Abduction       (blank fields were intentionally left blank)  Hip Flex:  R: 4  L: 4    8/15:  R SLS: ~10 sec         Treatment     Therapeutic Exercises - Justified to address any of the following:  To develop strength, endurance, ROM and/or flexibility.   Manual stretching for R ankle/digits    - Introduced Bridges, x 10 reps with 5 sec hold; progressed with  Heel Raise, x 10 reps  - Seated Heel/Toe Raises, x 10 reps in between exercises  - Progressed to Standing Heel Raises with Ball, x 10 reps x 2 (cues for neutral foot position)    *Therapist providing verbal/tactile cuing to improve from and technique with exercises above.  *Reviewed updated HEP  Deferred today:  - Introduced Transport planner for Toe Flex/Ext ROM  - Reviewed Gastroc Stretch at Wall, 30 sec x 2 R only  - Reviewed Soleus/Plantar Stretch at Wall, 30 sec x 2 R only       Neuromuscular Re-Education - Justified to address any of the following:   Re-education of movement, balance, coordination, kinesthetic sense, posture and/or proprioception for sitting and/or standing activities.   - Reviewed Toe Yoga, x 20 reps (therapist providing demo and verbal cuing)  - Introduced Towel Scrunches, ~10 reps with 5 sec holds  - Introduced Magazine features editor for Intrinsic Foot muscles, x 1.5 min x 2  - Trials of R SLS    Manual Therapy - Justified to address any of the following:    Mobilization of joints and soft tissues, manipulation, manual lymphatic drainage, and/or manual traction.    Long Sitting: STM to plantar aspect of foot with/without Graston tool; Gr I-III MT/TCJ/subtalar joint mobs    Home Exercises   Access Code: [email or web address]  URL: [email or web address]  Date: 09/26/2020  Prepared by: Mauri Brooklyn         ---      Flowsheet Row ---   Total Time    Timed Minutes 45 minutes   Total Time 45 minutes            Assessment   Continuing to work on increased restriction along plantar surface of foot. Patient continues to report less restriction with ankle motion after MT. Patient able to tolerate progression to standing heel raises but required use of tennis ball to assist with maintenance of neutral foot position. Continue to progress hip strengthening and balance exercises as tolerated.  Patient is aware of diagnosis, prognosis and consents to plan of care: Yes  Plan   Progress POC to improve pain, ROM, strength  and overall function. Next visit: Hip Abd at Wall,    Goals      Goal 1: Increase ankle strength >5-/5 so patient will be able to ambulate without assistive device on level and uneven ground 60 minutes in community.   Sessions: 18      Goal 2: Improve single leg balance to >30 seconds so patient can negotiate level & uneven surfaces safely with no loss of balance.  8/15: Progressing- Patient demonstrates improved tolerance to weight on R LE but challenged to maintain balance beyond 10 sec. AC   Sessions: 18      Goal 3: Patient will demonstrate independence in prescribed HEP with proper form, sets and reps for safe discharge to an independent program.  8/15: Progressing- Patient continues to perform HEP. AC         Sessions: 18      Goal 4: Patient will increase FOTO score from 52 to > 75 to demonstrate improved overall function with ADLs.       Sessions: 18          Goal 5: Increase hip abd strength >4+/5 so patient can negotiate 1 flight(s) of stairs reciprocally without increased pain >3/10.   Sessions: 60                           Roetta Sessions, DPT

## 2020-10-09 NOTE — PT/OT Therapy Note (Signed)
Name: Omah Delahoussaye Greeson Age: 30 y.o.   Date of Service: 10/10/2020  Referring Physician: Riccardo Dubin, [place]   Date of Injury: 08/05/2020  Date Care Plan Established/Reviewed: 09/26/2020  Date Treatment Started: 09/26/2020  End of Certification Date: 12/24/2020  Sessions in Plan of Care: 18  Surgery Date: No data was found  MD Follow-up: No data was found  Medbridge Code: [email or web address]    Visit Count: 4   Diagnosis:   1. Right foot pain        Subjective     Daily Subjective   Pt states the foot is doing pretty good, but feels kind of stiff still. Pt notes occasional pain at fracture site w/ eversion    Social Support/Occupation    Lives in: multiple level home    Lives with: spouse    Occupation: works at [employer]    Precautions: No data was found  Allergies: Azithromycin    Objective            Initial Evaluation Reference and/or Current Measurements (as dated):    Range of Motion: (degrees)  Initial  Right  AROM Initial  Right PROM    Right AROM    Right PROM Ankle Initial  Left AROM Initial  Left PROM    Left AROM    Left PROM   3 knee ext       Dorsiflexion 4 knee straight         60       Plantarflexion 31         40       Inversion 36         10       Eversion 8                 Knee Flexion                   Knee Extension           (blank fields were intentionally left blank)  1st toe flex:  R: 15  L: 9  1st toe ext:  R: 10  L: 50     Hip AROM: WFL  Knee AROM: WFL     Strength:   Initial  R    R LE Strength  MMT /5 Initial  L    L    4-   Ankle Dorsiflexion 5     4-   Ankle Plantarflexion 5     3+   Ankle Inversion 5     3+   Ankle Eversion 5-     4+   Quadriceps 4+     4   Hamstrings 4+     3+   Hip Abduction       (blank fields were intentionally left blank)  Hip Flex:  R: 4  L: 4    8/15:  R SLS: ~10 sec         Treatment     Therapeutic Exercises - Justified to address any of the following:  To develop strength, endurance, ROM and/or flexibility.   - subjective taken for guidance of treatment and determination  of progress towards goals  - manual stretching for R ankle/digits  - reviewed/progressed bridges, 5"x12  - reviewed/progressed standing heel raises with ball, 2" hold, 2x10    *Therapist providing verbal/tactile cuing to improve from and technique with exercises above.    Deferred today:  - Introduced Manual Stretching for Toe Flex/Ext ROM  -  Reviewed Gastroc Stretch at Wall, 30 sec x 2 R only  - Reviewed Soleus/Plantar Stretch at Wall, 30 sec x 2 R only  - Seated Heel/Toe Raises, x 10 reps in between exercises       Neuromuscular Re-Education - Justified to address any of the following:   Re-education of movement, balance, coordination, kinesthetic sense, posture and/or proprioception for sitting and/or standing activities.   - reviewed/progressed toe yoga from seated to standing x 20 reps (therapist providing demo and verbal cuing)  - reviewed/progressed towel scrunches to towel pick ups x15  - reviewed marbles for intrinsic foot muscles, x 1.5 min  - introduced tandem stance w/ R leg in the back x1' --> tandem stance on airex w/ R leg in the back 2x30"  - introduced rockerboard AP taps x20    Manual Therapy - Justified to address any of the following:    Mobilization of joints and soft tissues, manipulation, manual lymphatic drainage, and/or manual traction.    Long Sitting: STM to plantar aspect of foot with/without Graston tool; Gr I-III MT/TCJ/subtalar joint mobs; 1st and 5th ray mobilizations gr I-II    Home Exercises   Access Code: [email or web address]  URL: [email or web address]  Date: 09/26/2020  Prepared by: Mauri Brooklyn         ---      Flowsheet Row ---   Total Time    Timed Minutes 44 minutes   Total Time 44 minutes              Assessment   Pt presents to clinic w/ stiffness in R foot. Introduced gentle 1st and 5th ray mobilizations, pt w/ improved ankle motion following. Progressed exercises performed previously to pt tolerance. Pt able to complete all exercises w/ out exacerbation of familiar s/s.      Patient is aware of diagnosis, prognosis and consents to plan of care: Yes  Plan   Progress POC to improve pain, ROM, strength and overall function. Next visit: Hip Abd at Wall,    Goals      Goal 1: Increase ankle strength >5-/5 so patient will be able to ambulate without assistive device on level and uneven ground 60 minutes in community.   Sessions: 18      Goal 2: Improve single leg balance to >30 seconds so patient can negotiate level & uneven surfaces safely with no loss of balance.  8/15: Progressing- Patient demonstrates improved tolerance to weight on R LE but challenged to maintain balance beyond 10 sec. AC   Sessions: 18      Goal 3: Patient will demonstrate independence in prescribed HEP with proper form, sets and reps for safe discharge to an independent program.  8/15: Progressing- Patient continues to perform HEP. AC         Sessions: 18      Goal 4: Patient will increase FOTO score from 52 to > 75 to demonstrate improved overall function with ADLs.       Sessions: 18          Goal 5: Increase hip abd strength >4+/5 so patient can negotiate 1 flight(s) of stairs reciprocally without increased pain >3/10.   Sessions: 42                           Vista Mink, DPT

## 2020-10-10 ENCOUNTER — Inpatient Hospital Stay: Payer: No Typology Code available for payment source | Attending: Neurological Surgery

## 2020-10-10 DIAGNOSIS — M79671 Pain in right foot: Secondary | ICD-10-CM | POA: Insufficient documentation

## 2020-10-11 NOTE — PT/OT Therapy Note (Signed)
Name: Carol Sherman Age: 30 y.o.   Date of Service: 10/12/2020  Referring Physician: Riccardo Dubin, [place]   Date of Injury: 08/05/2020  Date Care Plan Established/Reviewed: 09/26/2020  Date Treatment Started: 09/26/2020  End of Certification Date: 12/24/2020  Sessions in Plan of Care: 18  Surgery Date: No data was found  MD Follow-up: No data was found  Medbridge Code: [email or web address]    Visit Count: 5   Diagnosis:   1. Right foot pain        Subjective     Daily Subjective   Pt notes some soreness on lateral foot in the evening after last treatment, feels fine now. Pt denies pain today, just notes tightness on medial arch and lateral dorsum of the foot    Social Support/Occupation    Lives in: multiple level home    Lives with: spouse    Occupation: works at [employer]    Precautions: No data was found  Allergies: Azithromycin    Objective            Initial Evaluation Reference and/or Current Measurements (as dated):    Range of Motion: (degrees)  Initial  Right  AROM Initial  Right PROM    Right AROM    Right PROM Ankle Initial  Left AROM Initial  Left PROM    Left AROM    Left PROM   3 knee ext       Dorsiflexion 4 knee straight         60       Plantarflexion 31         40       Inversion 36         10       Eversion 8                 Knee Flexion                   Knee Extension           (blank fields were intentionally left blank)  1st toe flex:  R: 15  L: 9  1st toe ext:  R: 10  L: 50     Hip AROM: WFL  Knee AROM: WFL     Strength:   Initial  R    R LE Strength  MMT /5 Initial  L    L    4-   Ankle Dorsiflexion 5     4-   Ankle Plantarflexion 5     3+   Ankle Inversion 5     3+   Ankle Eversion 5-     4+   Quadriceps 4+     4   Hamstrings 4+     3+   Hip Abduction       (blank fields were intentionally left blank)  Hip Flex:  R: 4  L: 4    8/15:  R SLS: ~10 sec    8/26:  R SLS >40 sec         Treatment     Therapeutic Exercises - Justified to address any of the following:  To develop strength, endurance, ROM and/or  flexibility.   - subjective taken for guidance of treatment and determination of progress towards goals  - reviewed/progressed bridges + heel lift, 5"x15  - reviewed/progressed standing heel raises with ball, 2" hold, 2x15  - introduced gastroc stretch on prostretch x1'    *Therapist providing verbal/tactile cuing  to improve from and technique with exercises above.    Deferred today:  - manual stretching for R ankle/digits  - Introduced Transport planner for Toe Flex/Ext ROM  - Reviewed Gastroc Stretch at Wall, 30 sec x 2 R only  - Reviewed Soleus/Plantar Stretch at Wall, 30 sec x 2 R only  - Seated Heel/Toe Raises, x 10 reps in between exercises    Neuromuscular Re-Education - Justified to address any of the following:   Re-education of movement, balance, coordination, kinesthetic sense, posture and/or proprioception for sitting and/or standing activities.   - reviewed toe yoga from seated to standing x 20 reps (therapist providing demo and verbal cuing)  - reviewed/progressed towel pick ups x20  - reviewed marbles for intrinsic foot muscles, x2 min  - introduced standing hip abd w/ toe IR x12  - reviewed/progressed tandem stance on airex w/ R leg in the back 2x45"  - reviewed rockerboard AP taps x20  - SLS practice, measurement captured as NMR    Manual Therapy - Justified to address any of the following:    Mobilization of joints and soft tissues, manipulation, manual lymphatic drainage, and/or manual traction.    Long Sitting: STM to plantar aspect of foot with/without Graston tool; Gr I-III MT/TCJ/subtalar joint mobs; 1st and 5th ray mobilizations gr I-II    Home Exercises   Access Code: [email or web address]  URL: [email or web address]  Date: 09/26/2020  Prepared by: Mauri Brooklyn         ---      Flowsheet Row ---   Total Time    Timed Minutes 41 minutes   Total Time 41 minutes          Assessment   Pt presents to clinic w/ low irritability in R foot, but stiffness in the medial plantar fascia. Session emphasized  progression of exercises performed previously w/ addition of hip abd movements. Pt able to complete all exercises w/ out exacerbation of familiar s/s.     Patient is aware of diagnosis, prognosis and consents to plan of care: Yes  Plan   Progress POC to improve pain, ROM, strength and overall function.    Goals      Goal 1: Increase ankle strength >5-/5 so patient will be able to ambulate without assistive device on level and uneven ground 60 minutes in community.   Sessions: 18      Goal 2: Improve single leg balance to >30 seconds so patient can negotiate level & uneven surfaces safely with no loss of balance.    8/26: Met (AB)   Sessions: 18      Goal 3: Patient will demonstrate independence in prescribed HEP with proper form, sets and reps for safe discharge to an independent program.  8/15: Progressing- Patient continues to perform HEP. AC         Sessions: 18      Goal 4: Patient will increase FOTO score from 52 to > 75 to demonstrate improved overall function with ADLs.       Sessions: 18          Goal 5: Increase hip abd strength >4+/5 so patient can negotiate 1 flight(s) of stairs reciprocally without increased pain >3/10.   Sessions: 69                           Vista Mink, DPT

## 2020-10-12 ENCOUNTER — Inpatient Hospital Stay: Payer: No Typology Code available for payment source | Attending: Neurological Surgery

## 2020-10-12 DIAGNOSIS — M79671 Pain in right foot: Secondary | ICD-10-CM | POA: Insufficient documentation

## 2020-10-16 NOTE — PT/OT Therapy Note (Signed)
Name: Carol Sherman Age: 30 y.o.   Date of Service: 10/17/2020  Referring Physician: Riccardo Dubin, [place]   Date of Injury: 08/05/2020  Date Care Plan Established/Reviewed: 09/26/2020  Date Treatment Started: 09/26/2020  End of Certification Date: 12/24/2020  Sessions in Plan of Care: 18  Surgery Date: No data was found  MD Follow-up: No data was found  Medbridge Code: [email or web address]    Visit Count: 6   Diagnosis:   1. Right foot pain        Subjective     Daily Subjective   Pt reports she was wearing flat shoes without support to walk about >1 mile and her foot was stiff. Pt was able to walk 1.5 miles to[place] in her supportive shoes and she felt fine. Pt pinpoints to stiffness where her fracture was with EV motions    Social Support/Occupation    Lives in: multiple level home    Lives with: spouse    Occupation: works at [employer]    Precautions: No data was found  Allergies: Azithromycin    Objective            Initial Evaluation Reference and/or Current Measurements (as dated):    Range of Motion: (degrees)  Initial  Right  AROM Initial  Right PROM    Right AROM    Right PROM Ankle Initial  Left AROM Initial  Left PROM    Left AROM    Left PROM   3 knee ext       Dorsiflexion 4 knee straight         60       Plantarflexion 31         40       Inversion 36         10       Eversion 8                 Knee Flexion                   Knee Extension           (blank fields were intentionally left blank)  1st toe flex:  R: 15  L: 9  1st toe ext:  R: 10  L: 50     Hip AROM: WFL  Knee AROM: WFL     Strength:   Initial  R    R LE Strength  MMT /5 Initial  L    L    4-   Ankle Dorsiflexion 5     4-   Ankle Plantarflexion 5     3+   Ankle Inversion 5     3+   Ankle Eversion 5-     4+   Quadriceps 4+     4   Hamstrings 4+     3+   Hip Abduction       (blank fields were intentionally left blank)  Hip Flex:  R: 4  L: 4    8/15:  R SLS: ~10 sec    8/26:  R SLS >40 sec         Treatment     Therapeutic Exercises - Justified to address any  of the following:  To develop strength, endurance, ROM and/or flexibility.   - subjective taken for guidance of treatment and determination of progress towards goals  - reviewed/progressed bridges + heel lift, 5"x20  - reviewed/progressed standing heel raises with ball, 2" hold, 2x20 (cues  for decreased range d/t pain in plantar fascia at end range of toe extension)  - reviewed gastroc stretch on prostretch x1'  - introduced great toe stretch 10"x10    *Therapist providing verbal/tactile cuing to improve from and technique with exercises above.    Deferred today:  - manual stretching for R ankle/digits  - Introduced Transport planner for Toe Flex/Ext ROM  - Reviewed Gastroc Stretch at Wall, 30 sec x 2 R only  - Reviewed Soleus/Plantar Stretch at Wall, 30 sec x 2 R only  - Seated Heel/Toe Raises, x 10 reps in between exercises    Neuromuscular Re-Education - Justified to address any of the following:   Re-education of movement, balance, coordination, kinesthetic sense, posture and/or proprioception for sitting and/or standing activities.   - reviewed toe yoga from seated to standing x 20 reps (therapist providing demo and verbal cuing)  - reviewed towel pick ups x20  - reviewed/progressed standing hip abd w/ toe IR x15  - reviewed tandem stance on airex w/ R leg in the back 2x45"  - reviewed rockerboard AP taps x20    Deferred:  - reviewed marbles for intrinsic foot muscles, x2 min    Manual Therapy - Justified to address any of the following:    Mobilization of joints and soft tissues, manipulation, manual lymphatic drainage, and/or manual traction.    Long Sitting: STM to plantar aspect of foot with/without Graston tool; Gr I-III MT/TCJ/subtalar joint mobs; 1st and 5th ray mobilizations gr I-II    Home Exercises   Access Code: [email or web address]  URL: [email or web address]  Date: 09/26/2020  Prepared by: Mauri Brooklyn         ---      Flowsheet Row ---   Total Time    Timed Minutes 44 minutes   Total Time 44 minutes             Assessment   Pt presents to clinic w/ pain after walking in shoes without support. Pt continues to experience tautness in the medial plantar fascia. Session emphasized progression of exercises to improve tolerance for SLS. Pt able to complete all exercises w/ out exacerbation of familiar s/s. Pt would continue to benefit from skilled PT services to improve foot mobility to allow for pain free ambulation and stair negotiation.     Plan   Progress POC to improve pain, ROM, strength and overall function; improve tolerance for activities in SLS; monster walks next time?    Goals      Goal 1: Increase ankle strength >5-/5 so patient will be able to ambulate without assistive device on level and uneven ground 60 minutes in community.   Sessions: 18      Goal 2: Improve single leg balance to >30 seconds so patient can negotiate level & uneven surfaces safely with no loss of balance.    8/26: Met (AB)   Sessions: 18      Goal 3: Patient will demonstrate independence in prescribed HEP with proper form, sets and reps for safe discharge to an independent program.  8/15: Progressing- Patient continues to perform HEP. AC         Sessions: 18      Goal 4: Patient will increase FOTO score from 52 to > 75 to demonstrate improved overall function with ADLs.       Sessions: 18          Goal 5: Increase hip abd strength >4+/5 so patient can negotiate 1 flight(s) of stairs reciprocally  without increased pain >3/10.   Sessions: 34                           Vista Mink, DPT

## 2020-10-17 ENCOUNTER — Inpatient Hospital Stay: Payer: No Typology Code available for payment source | Attending: Neurological Surgery

## 2020-10-17 DIAGNOSIS — M79671 Pain in right foot: Secondary | ICD-10-CM | POA: Insufficient documentation

## 2020-10-25 ENCOUNTER — Inpatient Hospital Stay
Payer: No Typology Code available for payment source | Attending: Neurological Surgery | Admitting: Rehabilitative and Restorative Service Providers"

## 2020-10-25 DIAGNOSIS — M79671 Pain in right foot: Secondary | ICD-10-CM

## 2020-10-25 NOTE — PT/OT Therapy Note (Signed)
Name: Carol Sherman Age: 30 y.o.   Date of Service: 10/25/2020  Referring Physician: Riccardo Dubin, [place]   Date of Injury: 08/05/2020  Date Care Plan Established/Reviewed: 09/26/2020  Date Treatment Started: 09/26/2020  End of Certification Date: 12/24/2020  Sessions in Plan of Care: 18  Surgery Date: No data was found  MD Follow-up: No data was found  Medbridge Code: [email or web address]    Visit Count: 7   Diagnosis:   1. Right foot pain        Subjective     Daily Subjective   Patient has been doing well overall. Continues to walk at lunch time and foot doesn't bother her as much. Goes on trip and plans to hike a bit in ~2 weeks.    Social Support/Occupation    Lives in: multiple level home    Lives with: spouse    Occupation: works at [employer]    Precautions: No data was found  Allergies: Azithromycin    Objective            Initial Evaluation Reference and/or Current Measurements (as dated):    Range of Motion: (degrees)  Initial  Right  AROM Initial  Right PROM    Right AROM    Right PROM Ankle Initial  Left AROM Initial  Left PROM    Left AROM    Left PROM   3 knee ext       Dorsiflexion 4 knee straight         60       Plantarflexion 31         40       Inversion 36         10       Eversion 8                 Knee Flexion                   Knee Extension           (blank fields were intentionally left blank)  1st toe flex:  R: 15  L: 9  1st toe ext:  R: 10  L: 50     Hip AROM: WFL  Knee AROM: WFL     Strength:   Initial  R    R LE Strength  MMT /5 Initial  L    L    4-   Ankle Dorsiflexion 5     4-   Ankle Plantarflexion 5     3+   Ankle Inversion 5     3+   Ankle Eversion 5-     4+   Quadriceps 4+     4   Hamstrings 4+     3+   Hip Abduction       (blank fields were intentionally left blank)  Hip Flex:  R: 4  L: 4    8/15:  R SLS: ~10 sec    8/26:  R SLS >40 sec    9/8:   >30 sec R SLS  16 R SLS on airex         Treatment     Therapeutic Exercises - Justified to address any of the following:  To develop strength,  endurance, ROM and/or flexibility.   Manual stretching for R ankle    - Introduced Resisted 4 Way Ankle, x 10-12 reps each way  - PF Stretch, 30 sec x 2  - Introduced Squats, x  10 reps x 3  - Introduced Reverse Lunges, x 10 reps B    *Therapist providing verbal/tactile cuing to improve from and technique with exercises above.    Deferred today:  - Introduced Transport planner for Toe Flex/Ext ROM  - Reviewed Gastroc Stretch at Wall, 30 sec x 2 R only  - Reviewed Soleus/Plantar Stretch at Wall, 30 sec x 2 R only  - Seated Heel/Toe Raises, x 10 reps in between exercises  - reviewed/progressed bridges + heel lift, 5"x20  - reviewed/progressed standing heel raises with ball, 2" hold, 2x20 (cues for decreased range d/t pain in plantar fascia at end range of toe extension)  - reviewed gastroc stretch on prostretch x1'  - introduced great toe stretch 10"x10    Neuromuscular Re-Education - Justified to address any of the following:   Re-education of movement, balance, coordination, kinesthetic sense, posture and/or proprioception for sitting and/or standing activities.   - Trials of SLS; progressed on R SLS on Airex, 3 trials  - Introduced Lunge onto [employer], x 10 reps with 5 sec hold B  - Introduced Runner Step up on [employer], x 10 reps B    Deferred:  - reviewed marbles for intrinsic foot muscles, x2 min  - reviewed toe yoga from seated to standing x 20 reps (therapist providing demo and verbal cuing)  - reviewed towel pick ups x20  - reviewed/progressed standing hip abd w/ toe IR x15  - reviewed tandem stance on airex w/ R leg in the back 2x45"  - reviewed rockerboard AP taps x20    Manual Therapy - Justified to address any of the following:    Mobilization of joints and soft tissues, manipulation, manual lymphatic drainage, and/or manual traction.    Long Sitting: STM to plantar aspect of foot with/without Graston tool; Gr I-III MT/TCJ/subtalar joint mobs; 1st and 5th ray mobilizations gr I-II    Home Exercises   Access Code:  [email or web address]  URL: [email or web address]  Date: 09/26/2020  Prepared by: Mauri Brooklyn         ---      Flowsheet Row ---   Total Time    Timed Minutes 48 minutes   Total Time 48 minutes            Assessment   Patient demonstrates improved tolerance to SLS on ground and on Airex today without exacerbation of symptoms. Patient challenged by progression to more dynamic balance activities, but able to perform without exacerbation of symptoms. Continue to progress exercises to improve ankle strength and balance in preparation for hiking trip in a couple of weeks.  Plan   Progress POC to improve pain, ROM, strength and overall function; Next session: monster walks next time? - oversight today    Goals      Goal 1: Increase ankle strength >5-/5 so patient will be able to ambulate without assistive device on level and uneven ground 60 minutes in community.  9/8: Progressing- Initiated ankle strength today. Occasional discomfort with communtiy. AC   Sessions: 18      Goal 2: Improve single leg balance to >30 seconds so patient can negotiate level & uneven surfaces safely with no loss of balance.    8/26: Met (AB)   Sessions: 18      Goal 3: Patient will demonstrate independence in prescribed HEP with proper form, sets and reps for safe discharge to an independent program.  8/15: Progressing- Patient continues to perform HEP. AC  Sessions: 18      Goal 4: Patient will increase FOTO score from 52 to > 75 to demonstrate improved overall function with ADLs.       Sessions: 18          Goal 5: Increase hip abd strength >4+/5 so patient can negotiate 1 flight(s) of stairs reciprocally without increased pain >3/10.   Sessions: 55                           Roetta Sessions, DPT

## 2020-11-01 ENCOUNTER — Inpatient Hospital Stay: Payer: No Typology Code available for payment source | Admitting: Rehabilitative and Restorative Service Providers"

## 2020-11-06 ENCOUNTER — Inpatient Hospital Stay: Payer: No Typology Code available for payment source

## 2020-11-19 ENCOUNTER — Inpatient Hospital Stay
Payer: No Typology Code available for payment source | Attending: Neurological Surgery | Admitting: Rehabilitative and Restorative Service Providers"

## 2020-11-19 DIAGNOSIS — M79671 Pain in right foot: Secondary | ICD-10-CM | POA: Insufficient documentation

## 2020-11-19 NOTE — PT/OT Therapy Note (Signed)
Name: Carol Sherman Age: 30 y.o.   Date of Service: 11/19/2020  Referring Physician: Riccardo Dubin, [place]   Date of Injury: 08/05/2020  Date Care Plan Established/Reviewed: 09/26/2020  Date Treatment Started: 09/26/2020  End of Certification Date: 12/24/2020  Sessions in Plan of Care: 18  Surgery Date: No data was found  MD Follow-up: No data was found  Medbridge Code: [email or web address]    Visit Count: 8   Diagnosis:   1. Right foot pain        Subjective     Daily Subjective   Went on trip - things felt good. In [place], she did some walking on the [employer]. Does admit she's a bit fearful with exercises at times. Has occasional tightness and notices some discomfort with weather changes where the break is.    Social Support/Occupation    Lives in: multiple level home    Lives with: spouse    Occupation: works at [employer]    Precautions: No data was found  Allergies: Azithromycin    Objective            Initial Evaluation Reference and/or Current Measurements (as dated):    Range of Motion: (degrees)  Initial  Right  AROM Initial  Right PROM    Right AROM    Right PROM Ankle Initial  Left AROM Initial  Left PROM    Left AROM    Left PROM   3 knee ext       Dorsiflexion 4 knee straight         60       Plantarflexion 31         40       Inversion 36         10       Eversion 8                 Knee Flexion                   Knee Extension           (blank fields were intentionally left blank)  1st toe flex:  R: 15  L: 9  1st toe ext:  R: 10  L: 50     Hip AROM: WFL  Knee AROM: WFL     Strength:   Initial  R  10/3  R LE Strength  MMT /5 Initial  L    L    4- 4+  Ankle Dorsiflexion 5     4-  5- Ankle Plantarflexion 5     3+  4+ Ankle Inversion 5     3+  4+ Ankle Eversion 5-     4+   Quadriceps 4+     4   Hamstrings 4+     3+   Hip Abduction       (blank fields were intentionally left blank)  Hip Flex:  R: 4  L: 4    8/15:  R SLS: ~10 sec    8/26:  R SLS >40 sec    9/8:   >30 sec R SLS  16 R SLS on airex           Treatment      Therapeutic Exercises - Justified to address any of the following:  To develop strength, endurance, ROM and/or flexibility.   Manual stretching for R ankle. Reassessed R ankle strength.    - Reviewed Resisted 4 Way Ankle with RTB,  x 12 reps each way  - Introduced Jog on [employer], x 1 min  - Introduced [employer], x 20 reps  - Introduced [employer] with RTB around Feet, x 5 laps  - Progressed Squats with 10# KB, x 10 reps x 2    *Therapist providing verbal/tactile cuing to improve from and technique with exercises above.  *Reviewed HEP - discussed progression and POC going forward.    Deferred today:  - PF Stretch, 30 sec x 2  - Introduced Transport planner for Toe Flex/Ext ROM  - Reviewed Gastroc Stretch at Wall, 30 sec x 2 R only  - Reviewed Soleus/Plantar Stretch at Wall, 30 sec x 2 R only  - Seated Heel/Toe Raises, x 10 reps in between exercises  - reviewed/progressed bridges + heel lift, 5"x20  - reviewed/progressed standing heel raises with ball, 2" hold, 2x20 (cues for decreased range d/t pain in plantar fascia at end range of toe extension)  - reviewed gastroc stretch on prostretch x1'  - introduced great toe stretch 10"x10  - Introduced Reverse Lunges, x 10 reps B    Neuromuscular Re-Education - Justified to address any of the following:   Re-education of movement, balance, coordination, kinesthetic sense, posture and/or proprioception for sitting and/or standing activities.   - Trials of SLS  - Lunge onto Bosu, x 10 reps with 5 sec hold R only  - Runner Step up on Bosu, x 10 reps with 5 sec hold  - Introduced Development worker, international aid on [employer], x 1 min each way A/P and lateral    Deferred:  - reviewed marbles for intrinsic foot muscles, x2 min  - reviewed toe yoga from seated to standing x 20 reps (therapist providing demo and verbal cuing)  - reviewed towel pick ups x20  - reviewed/progressed standing hip abd w/ toe IR x15  - reviewed tandem stance on airex w/ R leg in the back 2x45"  -  reviewed rockerboard AP taps x20    Manual Therapy - Justified to address any of the following:    Mobilization of joints and soft tissues, manipulation, manual lymphatic drainage, and/or manual traction.    Long Sitting: STM to plantar aspect of foot with/without Graston tool; Gr I-III MT/TCJ/subtalar joint mobs; 1st and 5th ray mobilizations gr I-II    Home Exercises   Access Code: [email or web address]  URL: [email or web address]  Date: 09/26/2020  Prepared by: Mauri Brooklyn         ---      Flowsheet Row ---   Total Time    Timed Minutes 47 minutes   Total Time 47 minutes              Assessment   Patient demonstrates good improvement in ankle strength compared to last visit. Continuing to work on Editor, commissioning. No complaints with progression with weight for squats.  Plan   Progress POC to improve pain, ROM, strength and overall function. Next session: likely discharge    Goals      Goal 1: Increase ankle strength >5-/5 so patient will be able to ambulate without assistive device on level and uneven ground 60 minutes in community.  9/8: Progressing- Initiated ankle strength today. Occasional discomfort with communtiy. AC  10/3: Progressing- Patient demonstrates improvement in ankle strength bu tnot yet to goal level. Some soreness walking 3 miles. AC   Sessions: 18      Goal 2: Improve single leg balance to >30 seconds so patient can negotiate level &  uneven surfaces safely with no loss of balance.    8/26: Met (AB)   Sessions: 18      Goal 3: Patient will demonstrate independence in prescribed HEP with proper form, sets and reps for safe discharge to an independent program.  8/15: Progressing- Patient continues to perform HEP. AC  10/3: Progressing- Patient continues to perform HEP. AC   Sessions: 18      Goal 4: Patient will increase FOTO score from 52 to > 75 to demonstrate improved overall function with ADLs.       Sessions: 18          Goal 5: Increase hip abd strength >4+/5 so patient can negotiate 1  flight(s) of stairs reciprocally without increased pain >3/10.   Sessions: 39                           Roetta Sessions, DPT

## 2020-11-21 ENCOUNTER — Inpatient Hospital Stay
Payer: No Typology Code available for payment source | Attending: Neurological Surgery | Admitting: Rehabilitative and Restorative Service Providers"

## 2020-11-21 DIAGNOSIS — M79671 Pain in right foot: Secondary | ICD-10-CM

## 2020-11-21 NOTE — Progress Notes (Signed)
Discharge Note  Name: Carol Sherman Age: 30 y.o.   Date of Service: 11/21/2020  Referring Physician: Riccardo Dubin, [place]   Date of Injury: 08/05/2020  Date Care Plan Established/Reviewed: 09/26/2020 to 11/21/20  Date Treatment Started: 09/26/2020  End of Certification Date: 12/24/2020  Sessions in Plan of Care: 18  Surgery Date: No data was found  MD Follow-up: No data was found  Medbridge Code: [email or web address]    Visit Count: 9   Diagnosis:   1. Right foot pain        Subjective     Outcome Measure   Tool Used/Details: FOTO  Score: 76  Predicted Functional Outcome: 75    Daily Subjective   Went on trip - things felt good. In [place], she did some walking on the [employer]. Does admit she's a bit fearful with exercises at times. Has occasional tightness and notices some discomfort with weather changes where the break is.    Social Support/Occupation    Lives in: multiple level home    Lives with: spouse    Occupation: works at [employer]    Precautions: No data was found  Allergies: Azithromycin    Objective            Initial Evaluation Reference and/or Current Measurements (as dated):    Range of Motion: (degrees)  Initial  Right  AROM Initial  Right PROM  10/5  Right AROM    Right PROM Ankle Initial  Left AROM Initial  Left PROM    Left AROM    Left PROM   3 knee ext    6 knee ext   Dorsiflexion 4 knee straight         60   66    Plantarflexion 31         40    42   Inversion 36         10   12    Eversion 8                 Knee Flexion                   Knee Extension           (blank fields were intentionally left blank)  1st toe flex:  R: 15  L: 9  1st toe ext:  R: 10  L: 50     Hip AROM: WFL  Knee AROM: WFL     Strength:   Initial  R  10/3  R LE Strength  MMT /5 Initial  L    L    4- 4+  Ankle Dorsiflexion 5     4-  5- Ankle Plantarflexion 5     3+  4+ Ankle Inversion 5     3+  4+ Ankle Eversion 5-     4+   Quadriceps 4+     4   Hamstrings 4+     3+  4+ Hip Abduction       (blank fields were intentionally left  blank)  Hip Flex:  R: 4  L: 4    8/15:  R SLS: ~10 sec    8/26:  R SLS >40 sec    9/8:   >30 sec R SLS  16 R SLS on airex           Treatment     Therapeutic Exercises - Justified to address any of the following:  To develop strength, endurance,  ROM and/or flexibility.   Manual stretching for R ankle. Reassessed R ankle ROM.    - Calf Stretch on White Board, 30 sec x 3  - Standing Heel Raises, x 25 reps  - Resisted Lateral Walk with RTB around Feet, x 5 laps  - Reviewed Reverse Lunges, x 10 reps B    *Therapist providing verbal/tactile cuing to improve from and technique with exercises above.  *Reviewed HEP - discussed progression and POC going forward.    Deferred today:  - Progressed Squats with 10# KB, x 10 reps x 2  - Reviewed Resisted 4 Way Ankle with RTB, x 12 reps each way  - Introduced Jog on [employer], x 1 min  - PF Stretch, 30 sec x 2  - Introduced Transport planner for Toe Flex/Ext ROM  - Reviewed Theme park manager at [employer], 30 sec x 2 R only  - Reviewed Soleus/Plantar Stretch at Wall, 30 sec x 2 R only  - Seated Heel/Toe Raises, x 10 reps in between exercises  - reviewed/progressed bridges + heel lift, 5"x20  - reviewed/progressed standing heel raises with ball, 2" hold, 2x20 (cues for decreased range d/t pain in plantar fascia at end range of toe extension)  - reviewed gastroc stretch on prostretch x1'  - introduced great toe stretch 10"x10    Neuromuscular Re-Education - Justified to address any of the following:   Re-education of movement, balance, coordination, kinesthetic sense, posture and/or proprioception for sitting and/or standing activities.   - Runner Step up on Bosu, x 10 reps with 5 sec hold B  - Introduced Education officer, environmental on Bosu, x 20 reps with 5 sec hold  - Introduced [employer], x 10 reps B    Deferred:  - reviewed marbles for intrinsic foot muscles, x2 min  - reviewed toe yoga from seated to standing x 20 reps (therapist providing demo and verbal cuing)  - reviewed towel pick ups  x20  - reviewed/progressed standing hip abd w/ toe IR x15  - reviewed tandem stance on airex w/ R leg in the back 2x45"  - reviewed rockerboard AP taps x20  - Trials of SLS  - Lunge onto Bosu, x 10 reps with 5 sec hold R only  - Introduced Development worker, international aid on [employer], x 1 min each way A/P and lateral    Manual Therapy - Justified to address any of the following:    Mobilization of joints and soft tissues, manipulation, manual lymphatic drainage, and/or manual traction.    Long Sitting: STM to plantar aspect of foot with/without Graston tool; Gr I-III MT/TCJ/subtalar joint mobs; 1st and 5th ray mobilizations gr I-II    Home Exercises   Access Code: [email or web address]  URL: [email or web address]  Date: 11/21/2020  Prepared by: Mauri Brooklyn    Exercises  Foot Roller Plantar Massage - 2 x daily - 7 x weekly - 3 sets - 10 reps  Soleus Stretch on Wall - 2 x daily - 7 x weekly - 3 sets - 15 hold  Gastroc Stretch on Wall - 2 x daily - 7 x weekly - 3 sets - 15 hold  Seated Heel Raise - 2 x daily - 7 x weekly - 1 sets - 10 reps  Seated Heel Toe Raises - 2 x daily - 7 x weekly - 1 sets - 10 reps  Seated Great Toe Extension - 1 x daily - 7 x weekly - 1 sets - 10 reps  Seated Lesser Toes Extension - 1  x daily - 7 x weekly - 1 sets - 10 reps  Arch Lifting - 1 x daily - 7 x weekly - 3 sets - 10 reps  Side Stepping with Resistance at Feet - 1 x daily - 7 x weekly           ---      Flowsheet Row ---   Total Time    Timed Minutes 45 minutes   Total Time 45 minutes                Assessment   Carol Sherman is a 30 y.o. female who has completed 9 PT visits for R foot pain. Since starting therapy, the patient has demonstrated improvement in pain and ankle strength. She has been able to return to normal daily activities without exacerbation in leg pain. Therapist has spent time reviewing process of easing back into hiking/running to avoid exacerbation of symptoms. Carol Sherman has progressed well with skilled physical therapy and has been instructed in  comprehensive HEP and demonstrates understanding for self maintenance. The patient has met 4 out of 5 goals, and has shown good progression towards the goal not met. Pt has been advised to continue HEP and follow up with PT or MD if any further issues arise.  Plan   Recommend discharge from physical therapy with independent HEP.        Goals      Goal 1: Increase ankle strength >5-/5 so patient will be able to ambulate without assistive device on level and uneven ground 60 minutes in community.  9/8: Progressing- Initiated ankle strength today. Occasional discomfort with communtiy. AC  10/3: Progressing- Patient demonstrates improvement in ankle strength bu tnot yet to goal level. Some soreness walking 3 miles. AC  10/5: Partially Met- Patient demonstrates improved in ankle strength but not to goal level. Able to ambulate 3 miles and was able to try jogging without exacerbation in pain. AC   Sessions: 18      Goal 2: Improve single leg balance to >30 seconds so patient can negotiate level & uneven surfaces safely with no loss of balance.    8/26: Met (AB)   Sessions: 18      Goal 3: Patient will demonstrate independence in prescribed HEP with proper form, sets and reps for safe discharge to an independent program.  8/15: Progressing- Patient continues to perform HEP. AC  10/3: Progressing- Patient continues to perform HEP. AC   Sessions: 18      Goal 4: Patient will increase FOTO score from 52 to > 75 to demonstrate improved overall function with ADLs.  10/5: Met- Patient demonstrates FOTO score 76. AC   Sessions: 18          Goal 5: Increase hip abd strength >4+/5 so patient can negotiate 1 flight(s) of stairs reciprocally without increased pain >3/10.  10/5: Partially Met- Patient demonstrates no difficulty with stairs. Demonstrates some improvement in hip abd strength 4+/5. [facility]   Sessions: 23                           Roetta Sessions, DPT                Subjective     Social Support/Occupation    Lives in: multiple  level home    Lives with: spouse    Occupation: works at [employer]

## 2020-11-21 NOTE — PT/OT Therapy Note (Signed)
Discharge Note  Name: Carol Sherman Age: 30 y.o.   Date of Service: 11/21/2020  Referring Physician: Riccardo Dubin, [place]   Date of Injury: 08/05/2020  Date Care Plan Established/Reviewed: 09/26/2020 to 11/21/20  Date Treatment Started: 09/26/2020  End of Certification Date: 12/24/2020  Sessions in Plan of Care: 18  Surgery Date: No data was found  MD Follow-up: No data was found  Medbridge Code: [email or web address]    Visit Count: 9   Diagnosis:   1. Right foot pain        Subjective     Outcome Measure   Tool Used/Details: FOTO  Score: 76  Predicted Functional Outcome: 75    Daily Subjective   Went on trip - things felt good. In [place], she did some walking on the [employer]. Does admit she's a bit fearful with exercises at times. Has occasional tightness and notices some discomfort with weather changes where the break is.    Social Support/Occupation    Lives in: multiple level home    Lives with: spouse    Occupation: works at [employer]    Precautions: No data was found  Allergies: Azithromycin    Objective            Initial Evaluation Reference and/or Current Measurements (as dated):    Range of Motion: (degrees)  Initial  Right  AROM Initial  Right PROM  10/5  Right AROM    Right PROM Ankle Initial  Left AROM Initial  Left PROM    Left AROM    Left PROM   3 knee ext    6 knee ext   Dorsiflexion 4 knee straight         60   66    Plantarflexion 31         40    42   Inversion 36         10   12    Eversion 8                 Knee Flexion                   Knee Extension           (blank fields were intentionally left blank)  1st toe flex:  R: 15  L: 9  1st toe ext:  R: 10  L: 50     Hip AROM: WFL  Knee AROM: WFL     Strength:   Initial  R  10/3  R LE Strength  MMT /5 Initial  L    L    4- 4+  Ankle Dorsiflexion 5     4-  5- Ankle Plantarflexion 5     3+  4+ Ankle Inversion 5     3+  4+ Ankle Eversion 5-     4+   Quadriceps 4+     4   Hamstrings 4+     3+  4+ Hip Abduction       (blank fields were intentionally left  blank)  Hip Flex:  R: 4  L: 4    8/15:  R SLS: ~10 sec    8/26:  R SLS >40 sec    9/8:   >30 sec R SLS  16 R SLS on airex           Treatment     Therapeutic Exercises - Justified to address any of the following:  To develop strength, endurance,  ROM and/or flexibility.   Manual stretching for R ankle. Reassessed R ankle ROM.    - Calf Stretch on White Board, 30 sec x 3  - Standing Heel Raises, x 25 reps  - Resisted Lateral Walk with RTB around Feet, x 5 laps  - Reviewed Reverse Lunges, x 10 reps B    *Therapist providing verbal/tactile cuing to improve from and technique with exercises above.  *Reviewed HEP - discussed progression and POC going forward.    Deferred today:  - Progressed Squats with 10# KB, x 10 reps x 2  - Reviewed Resisted 4 Way Ankle with RTB, x 12 reps each way  - Introduced Jog on [employer], x 1 min  - PF Stretch, 30 sec x 2  - Introduced Transport planner for Toe Flex/Ext ROM  - Reviewed Theme park manager at [employer], 30 sec x 2 R only  - Reviewed Soleus/Plantar Stretch at Wall, 30 sec x 2 R only  - Seated Heel/Toe Raises, x 10 reps in between exercises  - reviewed/progressed bridges + heel lift, 5"x20  - reviewed/progressed standing heel raises with ball, 2" hold, 2x20 (cues for decreased range d/t pain in plantar fascia at end range of toe extension)  - reviewed gastroc stretch on prostretch x1'  - introduced great toe stretch 10"x10    Neuromuscular Re-Education - Justified to address any of the following:   Re-education of movement, balance, coordination, kinesthetic sense, posture and/or proprioception for sitting and/or standing activities.   - Runner Step up on Bosu, x 10 reps with 5 sec hold B  - Introduced Education officer, environmental on Bosu, x 20 reps with 5 sec hold  - Introduced [employer], x 10 reps B    Deferred:  - reviewed marbles for intrinsic foot muscles, x2 min  - reviewed toe yoga from seated to standing x 20 reps (therapist providing demo and verbal cuing)  - reviewed towel pick ups  x20  - reviewed/progressed standing hip abd w/ toe IR x15  - reviewed tandem stance on airex w/ R leg in the back 2x45"  - reviewed rockerboard AP taps x20  - Trials of SLS  - Lunge onto Bosu, x 10 reps with 5 sec hold R only  - Introduced Development worker, international aid on [employer], x 1 min each way A/P and lateral    Manual Therapy - Justified to address any of the following:    Mobilization of joints and soft tissues, manipulation, manual lymphatic drainage, and/or manual traction.    Long Sitting: STM to plantar aspect of foot with/without Graston tool; Gr I-III MT/TCJ/subtalar joint mobs; 1st and 5th ray mobilizations gr I-II    Home Exercises   Access Code: [email or web address]  URL: [email or web address]  Date: 11/21/2020  Prepared by: Mauri Brooklyn    Exercises  Foot Roller Plantar Massage - 2 x daily - 7 x weekly - 3 sets - 10 reps  Soleus Stretch on Wall - 2 x daily - 7 x weekly - 3 sets - 15 hold  Gastroc Stretch on Wall - 2 x daily - 7 x weekly - 3 sets - 15 hold  Seated Heel Raise - 2 x daily - 7 x weekly - 1 sets - 10 reps  Seated Heel Toe Raises - 2 x daily - 7 x weekly - 1 sets - 10 reps  Seated Great Toe Extension - 1 x daily - 7 x weekly - 1 sets - 10 reps  Seated Lesser Toes Extension - 1  x daily - 7 x weekly - 1 sets - 10 reps  Arch Lifting - 1 x daily - 7 x weekly - 3 sets - 10 reps  Side Stepping with Resistance at Feet - 1 x daily - 7 x weekly           ---      Flowsheet Row ---   Total Time    Timed Minutes 45 minutes   Total Time 45 minutes                Assessment   Carol Sherman is a 30 y.o. female who has completed 9 PT visits for R foot pain. Since starting therapy, the patient has demonstrated improvement in pain and ankle strength. She has been able to return to normal daily activities without exacerbation in leg pain. Therapist has spent time reviewing process of easing back into hiking/running to avoid exacerbation of symptoms. Carol Sherman has progressed well with skilled physical therapy and has been instructed in  comprehensive HEP and demonstrates understanding for self maintenance. The patient has met 4 out of 5 goals, and has shown good progression towards the goal not met. Pt has been advised to continue HEP and follow up with PT or MD if any further issues arise.  Plan   Recommend discharge from physical therapy with independent HEP.        Goals      Goal 1: Increase ankle strength >5-/5 so patient will be able to ambulate without assistive device on level and uneven ground 60 minutes in community.  9/8: Progressing- Initiated ankle strength today. Occasional discomfort with communtiy. AC  10/3: Progressing- Patient demonstrates improvement in ankle strength bu tnot yet to goal level. Some soreness walking 3 miles. AC  10/5: Partially Met- Patient demonstrates improved in ankle strength but not to goal level. Able to ambulate 3 miles and was able to try jogging without exacerbation in pain. AC   Sessions: 18      Goal 2: Improve single leg balance to >30 seconds so patient can negotiate level & uneven surfaces safely with no loss of balance.    8/26: Met (AB)   Sessions: 18      Goal 3: Patient will demonstrate independence in prescribed HEP with proper form, sets and reps for safe discharge to an independent program.  8/15: Progressing- Patient continues to perform HEP. AC  10/3: Progressing- Patient continues to perform HEP. AC   Sessions: 18      Goal 4: Patient will increase FOTO score from 52 to > 75 to demonstrate improved overall function with ADLs.  10/5: Met- Patient demonstrates FOTO score 76. AC   Sessions: 18          Goal 5: Increase hip abd strength >4+/5 so patient can negotiate 1 flight(s) of stairs reciprocally without increased pain >3/10.  10/5: Partially Met- Patient demonstrates no difficulty with stairs. Demonstrates some improvement in hip abd strength 4+/5. [facility]   Sessions: 59                           Roetta Sessions, DPT

## 2020-11-26 DIAGNOSIS — R011 Cardiac murmur, unspecified: Secondary | ICD-10-CM | POA: Insufficient documentation

## 2020-11-26 DIAGNOSIS — I499 Cardiac arrhythmia, unspecified: Secondary | ICD-10-CM | POA: Insufficient documentation

## 2020-11-27 ENCOUNTER — Ambulatory Visit (INDEPENDENT_AMBULATORY_CARE_PROVIDER_SITE_OTHER): Payer: No Typology Code available for payment source | Admitting: Cardiovascular Disease

## 2020-11-27 ENCOUNTER — Encounter (INDEPENDENT_AMBULATORY_CARE_PROVIDER_SITE_OTHER): Payer: Self-pay | Admitting: Cardiovascular Disease

## 2020-11-27 VITALS — BP 116/72 | HR 90 | Ht 62.0 in | Wt 121.0 lb

## 2020-11-27 DIAGNOSIS — I471 Supraventricular tachycardia: Secondary | ICD-10-CM

## 2020-11-27 DIAGNOSIS — I341 Nonrheumatic mitral (valve) prolapse: Secondary | ICD-10-CM

## 2020-11-27 DIAGNOSIS — R002 Palpitations: Secondary | ICD-10-CM

## 2020-11-27 MED ORDER — METOPROLOL SUCCINATE ER 25 MG PO TB24
12.5000 mg | ORAL_TABLET | Freq: Every day | ORAL | 3 refills | Status: DC
Start: 2020-11-27 — End: 2021-07-19

## 2020-11-27 NOTE — Progress Notes (Signed)
[facility] OFF[facility] PROGRESS NOTE    [facility] OFFICE -CARDIOLOGY  [address]  [place] [address]  Dept: [phone]  Dept Fax: [phone]       Patient Name: Carol Sherman  Date of Visit:  November 27, 2020  Date of Birth: 09-Feb-1991  AGE: 30 y.o.  Medical Record #: [id number]  Requesting Physician: Cristela Felt, MD    CHIEF COMPLAINT: Mitral Valve Prolapse and Palpitations      HISTORY OF PRESENT ILLNESS:    She is a pleasant 30 y.o. female who presents today for scheduled 25-month follow-up.    She is doing well, palpitations well suppressed on low-dose metoprolol 12.5 mg daily.  She has had two distinct brief episodes over the past month, each lasting around a minute.  Other than that she has been relatively palpitation-free since beta-blocker initiation 6 months ago.    She is discussing family-planning/pregnancy with her husband.  Her obstetrician wanted her to run this by me to make sure I do not see any barriers.  I do not.  We will continue her on beta-blockers throughout and can even have her followed in our cardio-OB clinic if needed.    She does not currently endorse any chest pain, shortness of breath, dyspnea on exertion, orthopnea, PND, edema, palpitations, nausea, diaphoresis, light-headedness, dizziness, syncope, or unusual bleeding.    PAST MEDICAL HISTORY: She has a past medical history of Abdominal Aorta Ultrasound (04/10/2015), Heart murmur, Inappropriate sinus tachycardia, Irregular heart beat, Mitral regurgitation, and Mitral valve abscess. She has a past surgical history that includes Dental surgery; broken tibia (2001); and Wisdom tooth extraction.    ALLERGIES:   Allergies   Allergen Reactions    Azithromycin Hives       MEDICATIONS:   Current Outpatient Medications   Medication Instructions    metoprolol succinate XL (TOPROL-XL) 12.5 mg, Oral, Daily        FAMILY HISTORY: family history includes Diabetes in  her maternal grandfather; Heart attack in her maternal grandfather; Heart disease in her maternal grandfather; Heart failure in her maternal grandfather; Hypertension in her mother; Other in her mother; Supraventricular tachycardia in an other family member.    SOCIAL HISTORY: She reports that she has never smoked. She has never used smokeless tobacco. She reports current alcohol use. She reports that she does not use drugs.    PHYSICAL EXAMINATION  Visit Vitals  BP 116/72 (BP Site: Left arm, Patient Position: Sitting, Cuff Size: Small)   Pulse 90   Ht 1.575 m (5\' 2" )   Wt 54.9 kg (121 lb)   BMI 22.13 kg/m     GENERAL: NAD, appears stated age  HEENT: No scleral icterus or conjunctival pallor, moist mucous membranes   NECK: No JVD, normal carotid upstrokes without bruits   CARDIAC: Normal rate, regular rhythm, normal S1 and S2, and no murmurs, rubs, or gallops   CHEST: Clear to auscultation bilaterally with normal respiratory effort, no wheezes or rales  ABDOMEN: Soft, non-tender, non-distended, normal bowel sounds  EXTREMITIES: No edema, 2+ DP/radial pulses bilaterally  SKIN: No rash or jaundice   NEUROLOGIC: Alert and oriented to time, place and person, normal mood and affect    MUSCULOSKELETAL: Normal muscle strength and tone bilaterally/symmetrically    LABS:   Lab Results   Component Value Date    WBC 4.41 04/11/2020    HGB 13.9 04/11/2020    HCT 40.9 04/11/2020  PLT 179 04/11/2020     Lab Results   Component Value Date    GLU 110 (H) 04/11/2020    BUN 15 04/11/2020    CREAT 0.8 04/11/2020    NA 138 04/11/2020    K 3.6 04/11/2020    CL 107 04/11/2020    CO2 22 04/11/2020    AST 18 04/11/2020    ALT 13 04/11/2020     Lab Results   Component Value Date    TSH 1.95 08/14/2020     No results found for: CHOL, TRIG, HDL, LDL      IMPRESSION:   Ms. Schurman is a 30 y.o. 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    14-day CAM in September 2021 showed  average HR 78 bpm (range 43-195 bpm), no sustained arrhythmias, 3 nonsustained episodes of PSVT correlating to patient-triggered symptoms (lasting 6-15 seconds with HR ranging 150-195 bpm, longest episode 15 seconds with average HR 171 bpm), trivial benign ectopy < 1% burden, no atrial fibrillation/flutter, and no bradyarrhythmias.  30-day MCT in spring 2022 showed a ~2 minute episode of atrial tachycardia and some brief nonsustained SVT episodes.  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    Trace MR by most recent echo in August, 2021.  Preserved LVEF.  Atypical chest pains that are nonexertional and somewhat sharp and brief in quality, likely noncoronary and may be noncardiac although may be a part of mitral valve prolapse syndrome.  Family history of mitral valve prolapse in both her mother and her maternal grandmother.  Strong family history of premature atherosclerotic heart disease and numerous second and third-degree relatives including grandparents and great uncles on the mother side and a paternal aunt on the father's side.  Exercise treadmill stress test 11/10/19 negative for ischemia.  No exercise-induced arrhythmias, although frequent PVCs.  Recent abnormal thyroid tests.  Chronically positive ANA.      RECOMMENDATIONS:    Repeat echocardiogram for reassessment of her MVP/MAD and for any interval development of MR.  Continue low-dose metoprolol.  I told her she can take an extra half tablet as needed and we could increase the dose if needed for more frequent/bothersome palpitation symptoms.  We discussed the [employer] app, which may be a good idea for her infrequent palpitation episodes to capture some of these on ECG which I told her I would be  happy to review.  If she does have more frequent/accelerating palpitation symptoms, she will let me know, and we can arrange continuous ambulatory ECG monitoring.  Regarding family-planning/potential pregnancy, there are no cardiac barriers to her moving forward with this.  We will continue her on beta-blockers throughout and can even have her followed in our cardio-OB clinic if needed.  I will tentatively plan to see her back annually.  If she has any cardiovascular symptom concerns in the interim I am happy to see her back sooner at any time.  Orders Placed This Encounter   Procedures    Echocardiogram Adult Complete W Clr/ Dopp Waveform    Office Visit ([facility] [place])         Orders Placed This Encounter   Medications    metoprolol succinate XL (TOPROL-XL) 25 MG 24 hr tablet     Sig: Take 0.5 tablets (12.5 mg total) by mouth daily     Dispense:  45 tablet     Refill:  3         SIGNED:    Arcola Jansky, MD    This note was generated by the Dragon speech recognition and may contain errors or omissions not intended by the user. Grammatical errors, random word insertions, deletions, pronoun errors, and incomplete sentences are occasional consequences of this technology due to software limitations. Not all errors are caught or corrected. If there are questions or concerns about the content of this note or information contained within the body of this dictation, they should be addressed directly with the author for clarification.

## 2020-12-11 ENCOUNTER — Encounter (INDEPENDENT_AMBULATORY_CARE_PROVIDER_SITE_OTHER): Payer: Self-pay

## 2020-12-13 ENCOUNTER — Ambulatory Visit
Admission: RE | Admit: 2020-12-13 | Discharge: 2020-12-13 | Disposition: A | Discharge status: Home / Self Care | Payer: No Typology Code available for payment source | Source: Ambulatory Visit | Attending: Cardiovascular Disease | Admitting: Cardiovascular Disease

## 2020-12-13 ENCOUNTER — Encounter (INDEPENDENT_AMBULATORY_CARE_PROVIDER_SITE_OTHER): Payer: Self-pay | Admitting: Cardiovascular Disease

## 2020-12-13 DIAGNOSIS — R002 Palpitations: Secondary | ICD-10-CM

## 2020-12-13 DIAGNOSIS — I471 Supraventricular tachycardia: Secondary | ICD-10-CM

## 2020-12-13 DIAGNOSIS — I341 Nonrheumatic mitral (valve) prolapse: Secondary | ICD-10-CM | POA: Insufficient documentation

## 2020-12-25 ENCOUNTER — Other Ambulatory Visit (FREE_STANDING_LABORATORY_FACILITY): Payer: No Typology Code available for payment source

## 2020-12-25 DIAGNOSIS — E559 Vitamin D deficiency, unspecified: Secondary | ICD-10-CM

## 2020-12-25 DIAGNOSIS — Z0000 Encounter for general adult medical examination without abnormal findings: Secondary | ICD-10-CM

## 2020-12-25 DIAGNOSIS — R768 Other specified abnormal immunological findings in serum: Secondary | ICD-10-CM

## 2020-12-25 LAB — COMPREHENSIVE METABOLIC PANEL
ALT: 13 U/L (ref 0–55)
AST (SGOT): 17 U/L (ref 5–41)
Albumin/Globulin Ratio: 1.8 (ref 0.9–2.2)
Albumin: 4.7 g/dL (ref 3.5–5.0)
Alkaline Phosphatase: 54 U/L (ref 37–117)
Anion Gap: 13 (ref 5.0–15.0)
BUN: 13 mg/dL (ref 7.0–21.0)
Bilirubin, Total: 0.6 mg/dL (ref 0.2–1.2)
CO2: 23 mEq/L (ref 17–29)
Calcium: 9.6 mg/dL (ref 8.5–10.5)
Chloride: 103 mEq/L (ref 99–111)
Creatinine: 0.7 mg/dL (ref 0.4–1.0)
Globulin: 2.6 g/dL (ref 2.0–3.6)
Glucose: 86 mg/dL (ref 70–100)
Potassium: 4.8 mEq/L (ref 3.5–5.3)
Protein, Total: 7.3 g/dL (ref 6.0–8.3)
Sodium: 139 mEq/L (ref 135–145)

## 2020-12-25 LAB — URINALYSIS REFLEX TO MICROSCOPIC EXAM - REFLEX TO CULTURE
Bilirubin, UA: NEGATIVE
Blood, UA: NEGATIVE
Glucose, UA: NEGATIVE
Ketones UA: NEGATIVE
Leukocyte Esterase, UA: NEGATIVE
Nitrite, UA: NEGATIVE
Protein, UR: NEGATIVE
Specific Gravity UA: 1.004 (ref 1.001–1.035)
Urine pH: 6.5 (ref 5.0–8.0)
Urobilinogen, UA: NORMAL mg/dL

## 2020-12-25 LAB — CBC AND DIFFERENTIAL
Absolute NRBC: 0 10*3/uL (ref 0.00–0.00)
Basophils Absolute Automated: 0.02 10*3/uL (ref 0.00–0.08)
Basophils Automated: 0.4 %
Eosinophils Absolute Automated: 0.07 10*3/uL (ref 0.00–0.44)
Eosinophils Automated: 1.4 %
Hematocrit: 41.3 % (ref 34.7–43.7)
Hgb: 13.5 g/dL (ref 11.4–14.8)
Immature Granulocytes Absolute: 0 10*3/uL (ref 0.00–0.07)
Immature Granulocytes: 0 %
Lymphocytes Absolute Automated: 1.69 10*3/uL (ref 0.42–3.22)
Lymphocytes Automated: 34.9 %
MCH: 30.9 pg (ref 25.1–33.5)
MCHC: 32.7 g/dL (ref 31.5–35.8)
MCV: 94.5 fL (ref 78.0–96.0)
MPV: 10.3 fL (ref 8.9–12.5)
Monocytes Absolute Automated: 0.31 10*3/uL (ref 0.21–0.85)
Monocytes: 6.4 %
Neutrophils Absolute: 2.75 10*3/uL (ref 1.10–6.33)
Neutrophils: 56.9 %
Nucleated RBC: 0 /100 WBC (ref 0.0–0.0)
Platelets: 246 10*3/uL (ref 142–346)
RBC: 4.37 10*6/uL (ref 3.90–5.10)
RDW: 13 % (ref 11–15)
WBC: 4.84 10*3/uL (ref 3.10–9.50)

## 2020-12-25 LAB — LIPID PANEL
Cholesterol / HDL Ratio: 3.1 Index
Cholesterol: 168 mg/dL (ref 0–199)
HDL: 54 mg/dL (ref 40–9999)
LDL Calculated: 96 mg/dL (ref 0–99)
Triglycerides: 88 mg/dL (ref 34–149)
VLDL Calculated: 18 mg/dL (ref 10–40)

## 2020-12-25 LAB — HEMOLYSIS INDEX: Hemolysis Index: 8 Index (ref 0–24)

## 2020-12-25 LAB — VITAMIN D,25 OH,TOTAL: Vitamin D, 25 OH, Total: 26 ng/mL — ABNORMAL LOW (ref 30–100)

## 2020-12-25 LAB — GFR: EGFR: >60

## 2020-12-27 LAB — PATHOLOGY REVIEW-ANA

## 2020-12-27 LAB — ANA SCREEN, IFA, WITH REFLEX TO TITER AND PATTERN
ANA Screen: POSITIVE — AB
ANA Titer: 1:80 {titer} — AB

## 2021-01-18 ENCOUNTER — Other Ambulatory Visit (INDEPENDENT_AMBULATORY_CARE_PROVIDER_SITE_OTHER): Payer: Self-pay | Admitting: Cardiovascular Disease

## 2021-04-02 ENCOUNTER — Other Ambulatory Visit (FREE_STANDING_LABORATORY_FACILITY): Payer: No Typology Code available for payment source

## 2021-04-02 DIAGNOSIS — E559 Vitamin D deficiency, unspecified: Secondary | ICD-10-CM

## 2021-04-02 LAB — VITAMIN D,25 OH,TOTAL: Vitamin D, 25 OH, Total: 28 ng/mL — ABNORMAL LOW (ref 30–100)

## 2021-04-10 ENCOUNTER — Other Ambulatory Visit: Payer: Self-pay | Admitting: Internal Medicine

## 2021-04-10 DIAGNOSIS — M79642 Pain in left hand: Secondary | ICD-10-CM

## 2021-04-30 ENCOUNTER — Ambulatory Visit: Payer: No Typology Code available for payment source

## 2021-05-07 ENCOUNTER — Other Ambulatory Visit: Payer: Self-pay | Admitting: Obstetrics & Gynecology

## 2021-05-07 DIAGNOSIS — N945 Secondary dysmenorrhea: Secondary | ICD-10-CM

## 2021-05-14 ENCOUNTER — Ambulatory Visit
Admission: RE | Admit: 2021-05-14 | Discharge: 2021-05-14 | Disposition: A | Discharge status: Home / Self Care | Payer: No Typology Code available for payment source | Source: Ambulatory Visit | Attending: Obstetrics & Gynecology | Admitting: Obstetrics & Gynecology

## 2021-05-14 DIAGNOSIS — N945 Secondary dysmenorrhea: Secondary | ICD-10-CM | POA: Insufficient documentation

## 2021-05-14 DIAGNOSIS — N83201 Unspecified ovarian cyst, right side: Secondary | ICD-10-CM | POA: Insufficient documentation

## 2021-05-16 ENCOUNTER — Ambulatory Visit: Payer: No Typology Code available for payment source | Attending: Internal Medicine

## 2021-05-16 DIAGNOSIS — M79642 Pain in left hand: Secondary | ICD-10-CM | POA: Insufficient documentation

## 2021-05-16 DIAGNOSIS — M79641 Pain in right hand: Secondary | ICD-10-CM

## 2021-05-16 DIAGNOSIS — M25442 Effusion, left hand: Secondary | ICD-10-CM | POA: Insufficient documentation

## 2021-05-16 MED ORDER — GADOTERATE MEGLUMINE 7.5 MMOL/15ML IV SOLN (CLARISCAN)
11.0000 mL | Freq: Once | INTRAVENOUS | Status: AC | PRN
Start: 2021-05-16 — End: 2021-05-16
  Administered 2021-05-16: 11 mL via INTRAVENOUS

## 2021-07-01 ENCOUNTER — Other Ambulatory Visit (FREE_STANDING_LABORATORY_FACILITY): Payer: No Typology Code available for payment source

## 2021-07-01 DIAGNOSIS — E559 Vitamin D deficiency, unspecified: Secondary | ICD-10-CM

## 2021-07-01 LAB — VITAMIN D,25 OH,TOTAL: Vitamin D, 25 OH, Total: 50 ng/mL (ref 30–100)

## 2021-07-19 ENCOUNTER — Other Ambulatory Visit (INDEPENDENT_AMBULATORY_CARE_PROVIDER_SITE_OTHER): Payer: Self-pay

## 2021-07-19 MED ORDER — METOPROLOL SUCCINATE ER 25 MG PO TB24
12.5000 mg | ORAL_TABLET | Freq: Every day | ORAL | 3 refills | Status: DC
Start: 2021-07-19 — End: 2022-01-27

## 2021-07-19 MED ORDER — METOPROLOL SUCCINATE ER 25 MG PO TB24
12.5000 mg | ORAL_TABLET | Freq: Every day | ORAL | 1 refills | Status: DC
Start: 2021-07-19 — End: 2021-07-19

## 2021-07-19 NOTE — Telephone Encounter (Signed)
Patient came into the office asking for printed script for metoprolol refill. Script signed by Dr. Loman Brooklyn (DOD) as Dr Eliseo Gum is currently out of the office.

## 2021-07-19 NOTE — Addendum Note (Signed)
Addended by: Polly Cobia on: 07/19/2021 02:03 PM     Modules accepted: Orders

## 2021-10-02 ENCOUNTER — Other Ambulatory Visit (FREE_STANDING_LABORATORY_FACILITY): Payer: No Typology Code available for payment source

## 2021-10-02 DIAGNOSIS — F419 Anxiety disorder, unspecified: Secondary | ICD-10-CM

## 2021-10-02 LAB — TSH: TSH: 1.79 u[IU]/mL (ref 0.35–4.94)

## 2021-10-02 LAB — T4, FREE: T4 Free: 1.02 ng/dL (ref 0.69–1.48)

## 2021-11-22 ENCOUNTER — Other Ambulatory Visit: Payer: Self-pay

## 2021-11-28 ENCOUNTER — Other Ambulatory Visit: Payer: Self-pay

## 2022-01-06 ENCOUNTER — Other Ambulatory Visit: Payer: No Typology Code available for payment source

## 2022-01-13 ENCOUNTER — Other Ambulatory Visit (FREE_STANDING_LABORATORY_FACILITY): Payer: No Typology Code available for payment source

## 2022-01-13 DIAGNOSIS — Z0000 Encounter for general adult medical examination without abnormal findings: Secondary | ICD-10-CM

## 2022-01-13 LAB — COMPREHENSIVE METABOLIC PANEL
ALT: 12 U/L (ref 0–55)
AST (SGOT): 19 U/L (ref 5–41)
Albumin/Globulin Ratio: 1.7 (ref 0.9–2.2)
Albumin: 4.4 g/dL (ref 3.5–5.0)
Alkaline Phosphatase: 57 U/L (ref 37–117)
Anion Gap: 10 (ref 5.0–15.0)
BUN: 15 mg/dL (ref 7.0–21.0)
Bilirubin, Total: 0.5 mg/dL (ref 0.2–1.2)
CO2: 23 mEq/L (ref 17–29)
Calcium: 9.1 mg/dL (ref 8.5–10.5)
Chloride: 101 mEq/L (ref 99–111)
Creatinine: 0.7 mg/dL (ref 0.4–1.0)
Globulin: 2.6 g/dL (ref 2.0–3.6)
Glucose: 86 mg/dL (ref 70–100)
Potassium: 4.4 mEq/L (ref 3.5–5.3)
Protein, Total: 7 g/dL (ref 6.0–8.3)
Sodium: 134 mEq/L — ABNORMAL LOW (ref 135–145)
eGFR: >60 mL/min/{1.73_m2} (ref >=60)

## 2022-01-13 LAB — LIPID PANEL
Cholesterol / HDL Ratio: 3.2 Index
Cholesterol: 157 mg/dL (ref 0–199)
HDL: 49 mg/dL (ref 40–9999)
LDL Calculated: 90 mg/dL (ref 0–99)
Triglycerides: 91 mg/dL (ref 34–149)
VLDL Calculated: 18 mg/dL (ref 10–40)

## 2022-01-13 LAB — CBC AND DIFFERENTIAL
Absolute NRBC: 0 10*3/uL (ref 0.00–0.00)
Basophils Absolute Automated: 0.02 10*3/uL (ref 0.00–0.08)
Basophils Automated: 0.4 %
Eosinophils Absolute Automated: 0.09 10*3/uL (ref 0.00–0.44)
Eosinophils Automated: 2 %
Hematocrit: 40.6 % (ref 34.7–43.7)
Hgb: 13.2 g/dL (ref 11.4–14.8)
Immature Granulocytes Absolute: 0.01 10*3/uL (ref 0.00–0.07)
Immature Granulocytes: 0.2 %
Instrument Absolute Neutrophil Count: 2.53 10*3/uL (ref 1.10–6.33)
Lymphocytes Absolute Automated: 1.67 10*3/uL (ref 0.42–3.22)
Lymphocytes Automated: 36.4 %
MCH: 30.3 pg (ref 25.1–33.5)
MCHC: 32.5 g/dL (ref 31.5–35.8)
MCV: 93.1 fL (ref 78.0–96.0)
MPV: 10 fL (ref 8.9–12.5)
Monocytes Absolute Automated: 0.27 10*3/uL (ref 0.21–0.85)
Monocytes: 5.9 %
Neutrophils Absolute: 2.53 10*3/uL (ref 1.10–6.33)
Neutrophils: 55.1 %
Nucleated RBC: 0 /100 WBC (ref 0.0–0.0)
Platelets: 241 10*3/uL (ref 142–346)
RBC: 4.36 10*6/uL (ref 3.90–5.10)
RDW: 13 % (ref 11–15)
WBC: 4.59 10*3/uL (ref 3.10–9.50)

## 2022-01-13 LAB — MICROALBUMIN, RANDOM URINE
Urine Creatinine, Random: 59.9 mg/dL
Urine Microalbumin, Random: <5 ug/ml (ref 0.0–30.0)

## 2022-01-13 LAB — HEMOLYSIS INDEX: Hemolysis Index: 10 Index (ref 0–24)

## 2022-01-15 ENCOUNTER — Other Ambulatory Visit (FREE_STANDING_LABORATORY_FACILITY): Payer: No Typology Code available for payment source

## 2022-01-15 DIAGNOSIS — E559 Vitamin D deficiency, unspecified: Secondary | ICD-10-CM

## 2022-01-15 LAB — VITAMIN D,25 OH,TOTAL: Vitamin D, 25 OH, Total: 21 ng/mL — ABNORMAL LOW (ref 30–100)

## 2022-01-17 ENCOUNTER — Other Ambulatory Visit: Payer: Self-pay

## 2022-01-17 DIAGNOSIS — I83892 Varicose veins of left lower extremities with other complications: Secondary | ICD-10-CM

## 2022-01-23 ENCOUNTER — Encounter (INDEPENDENT_AMBULATORY_CARE_PROVIDER_SITE_OTHER): Payer: Self-pay | Admitting: Cardiovascular Disease

## 2022-01-24 ENCOUNTER — Ambulatory Visit
Admission: RE | Admit: 2022-01-24 | Discharge: 2022-01-24 | Disposition: A | Discharge status: Home / Self Care | Payer: No Typology Code available for payment source | Source: Ambulatory Visit

## 2022-01-24 DIAGNOSIS — I83892 Varicose veins of left lower extremities with other complications: Secondary | ICD-10-CM

## 2022-01-27 ENCOUNTER — Ambulatory Visit (INDEPENDENT_AMBULATORY_CARE_PROVIDER_SITE_OTHER): Payer: No Typology Code available for payment source | Admitting: Cardiovascular Disease

## 2022-01-27 ENCOUNTER — Encounter (INDEPENDENT_AMBULATORY_CARE_PROVIDER_SITE_OTHER): Payer: Self-pay | Admitting: Cardiovascular Disease

## 2022-01-27 VITALS — BP 128/78 | HR 100 | Ht 63.0 in | Wt 122.0 lb

## 2022-01-27 DIAGNOSIS — R002 Palpitations: Secondary | ICD-10-CM

## 2022-01-27 DIAGNOSIS — I471 Supraventricular tachycardia, unspecified: Secondary | ICD-10-CM

## 2022-01-27 DIAGNOSIS — I341 Nonrheumatic mitral (valve) prolapse: Secondary | ICD-10-CM

## 2022-01-27 DIAGNOSIS — I83811 Varicose veins of right lower extremities with pain: Secondary | ICD-10-CM

## 2022-01-27 MED ORDER — METOPROLOL SUCCINATE ER 25 MG PO TB24
12.5000 mg | ORAL_TABLET | Freq: Every day | ORAL | 3 refills | Status: DC
Start: 2022-01-27 — End: 2022-10-31

## 2022-01-27 NOTE — Progress Notes (Signed)
[facility] OFFICE PROGRESS NOTE    [facility] OFFICE -CARDIOLOGY  [address]  [place] [address]  Dept: [phone]  Dept Fax: [phone]       Patient Name: Carol Sherman  Date of Visit:  January 27, 2022  Date of Birth: 1990/04/25  AGE: 31 y.o.  Medical Record #: [id number]  Requesting Physician: Dennison Nancy, FNP    CHIEF COMPLAINT: Palpitations and Cardiac Valve Problem      HISTORY OF PRESENT ILLNESS:    Carol Sherman is a pleasant 31 y.o. female who presents today for scheduled annual follow-up.    Carol Sherman is doing great with good suppression of her palpitations on the very low-dose metoprolol regimen.  Really no symptomatic concerns at all.  Her echocardiogram from October, 2022 was stable showing MVP with no significant MR.  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  It looks like the ultrasound in our system was ordered the wrong leg in the wrong study type (DVT study), so I am going to reorder this as a venous insufficiency limited study on the right leg.    Carol Sherman does not currently endorse any chest pain, shortness of breath, dyspnea on exertion, orthopnea, PND, edema, palpitations, nausea, diaphoresis, light-headedness, dizziness, syncope, or unusual bleeding.    PAST MEDICAL HISTORY: Carol Sherman has a past medical history of Abdominal Aorta Ultrasound (04/10/2015), H/O echocardiogram, Heart murmur, Inappropriate sinus tachycardia, Irregular heart beat, Mitral regurgitation, and Mitral valve abscess. Carol Sherman has a past surgical history that includes Dental surgery; broken tibia (2001); and Wisdom tooth extraction.    ALLERGIES:   Allergies   Allergen Reactions    Azithromycin Hives    Drospirenone-Ethinyl Estradiol Irregular Heart Rate     Other reaction(s): Irregular Heart Rate    Norethin Ace-Eth Estrad-Fe Other (See Comments)       MEDICATIONS:   Current Outpatient Medications    Medication Instructions    cetirizine (ZYRTEC ALLERGY) 10 mg, Oral, Daily    metoprolol succinate XL (TOPROL-XL) 12.5 mg, Oral, Daily    vitamin D (ergocalciferol) (DRISDOL) 50,000 Units, Oral, weekly        FAMILY HISTORY: family history includes Diabetes in her maternal grandfather; Heart attack in her maternal grandfather; Heart disease in her maternal grandfather; Heart failure in her maternal grandfather; Hypertension in her mother; Other in her mother; Supraventricular tachycardia in an other family member.    SOCIAL HISTORY: Carol Sherman reports that Carol Sherman has never smoked. Carol Sherman has never used smokeless tobacco. Carol Sherman reports current alcohol use. Carol Sherman reports that Carol Sherman does not use drugs.    PHYSICAL EXAMINATION  Visit Vitals  BP 128/78 (BP Site: Left arm, Patient Position: Sitting, Cuff Size: Medium)   Pulse 100   Ht 1.6 m (5\' 3" )   Wt 55.3 kg (122 lb)   BMI 21.61 kg/m       General: Cooperative, alert, no acute distress.  Neck: No carotid bruits, JVP normal.  Cardiac: Regular rate and rhythm, normal S1 and S2; no S3 or S4, no murmurs, no rubs, no gallops.  Pulmonary: Clear to auscultation bilaterally, no wheezing, no rhonchi, no rales.  Extremities: No edema.  RLE varicose vein on the calf  Vascular: 2+ DP/radial pulses bilaterally.      LABS:   Lab Results   Component Value Date    WBC 4.59 01/13/2022    HGB 13.2 01/13/2022  HCT 40.6 01/13/2022    PLT 241 01/13/2022     Lab Results   Component Value Date    GLU 86 01/13/2022    BUN 15.0 01/13/2022    CREAT 0.7 01/13/2022    NA 134 (L) 01/13/2022    K 4.4 01/13/2022    CL 101 01/13/2022    CO2 23 01/13/2022    AST 19 01/13/2022    ALT 12 01/13/2022     Lab Results   Component Value Date    TSH 1.79 10/02/2021     Lab Results   Component Value Date    CHOL 157 01/13/2022    TRIG 91 01/13/2022    HDL 49 01/13/2022    LDL 90 01/13/2022         IMPRESSION:   Carol Sherman is a 31 y.o. 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    14-day CAM in September 2021 showed average HR 78 bpm (range 43-195 bpm), no sustained arrhythmias, 3 nonsustained episodes of PSVT correlating to patient-triggered symptoms (lasting 6-15 seconds with HR ranging 150-195 bpm, longest episode 15 seconds with average HR 171 bpm), trivial benign ectopy < 1% burden, no atrial fibrillation/flutter, and no bradyarrhythmias.  30-day MCT in spring 2022 showed a ~2 minute episode of atrial tachycardia and some brief nonsustained SVT episodes.  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    Trace MR by most recent echo in October, 2022.  Preserved LVEF.  Atypical chest pains that are nonexertional and somewhat sharp and brief in quality, likely noncoronary and may be noncardiac although may be a part of mitral valve prolapse syndrome.  Family history of mitral valve prolapse in both her mother and her maternal grandmother.  Strong family history of premature atherosclerotic heart disease and numerous second and third-degree relatives including grandparents and great uncles on the mother side and a paternal aunt on the father's side.  Exercise treadmill stress test 11/10/19 negative for ischemia.  No exercise-induced arrhythmias, although frequent PVCs.  Recent abnormal thyroid tests.  Chronically positive ANA.      RECOMMENDATIONS:    Continue the current low-dose metoprolol regimen for palpitation suppression with an extra dose as needed.  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  Annual follow-up.  We will do echoes every 3-5 years or sooner for symptomatic changes.                                                    Orders Placed This Encounter   Procedures    LE Venous Doppler Insufficiency - Limited    Office Visit ([facility] [place])         Orders Placed This Encounter   Medications    metoprolol succinate XL (TOPROL-XL) 25 MG 24 hr tablet     Sig: Take 0.5 tablets (12.5 mg) by mouth daily     Dispense:  45 tablet     Refill:  3  SIGNED:    Carlis Abbott, MD    This note was generated by the Dragon speech recognition and may contain errors or omissions not intended by the user. Grammatical errors, random word insertions, deletions, pronoun errors, and incomplete sentences are occasional consequences of this technology due to software limitations. Not all errors are caught or corrected. If there are questions or concerns about the content of this note or information contained within the body of this dictation, they should be addressed directly with the author for clarification.

## 2022-05-20 ENCOUNTER — Ambulatory Visit
Admission: RE | Admit: 2022-05-20 | Discharge: 2022-05-20 | Disposition: A | Discharge status: Home / Self Care | Payer: No Typology Code available for payment source | Source: Ambulatory Visit | Attending: Cardiovascular Disease | Admitting: Cardiovascular Disease

## 2022-05-20 ENCOUNTER — Other Ambulatory Visit (INDEPENDENT_AMBULATORY_CARE_PROVIDER_SITE_OTHER): Payer: Self-pay | Admitting: Cardiovascular Disease

## 2022-05-20 DIAGNOSIS — I471 Supraventricular tachycardia, unspecified: Secondary | ICD-10-CM

## 2022-05-20 DIAGNOSIS — I83811 Varicose veins of right lower extremities with pain: Secondary | ICD-10-CM

## 2022-05-20 DIAGNOSIS — R002 Palpitations: Secondary | ICD-10-CM

## 2022-05-20 DIAGNOSIS — I341 Nonrheumatic mitral (valve) prolapse: Secondary | ICD-10-CM

## 2022-10-31 ENCOUNTER — Other Ambulatory Visit (INDEPENDENT_AMBULATORY_CARE_PROVIDER_SITE_OTHER): Payer: Self-pay

## 2022-10-31 MED ORDER — METOPROLOL SUCCINATE ER 25 MG PO TB24
12.5000 mg | ORAL_TABLET | Freq: Every day | ORAL | 1 refills | Status: AC
Start: 2022-10-31 — End: ?

## 2022-10-31 NOTE — Telephone Encounter (Signed)
6 month supply of metoprolol sent in to patients new pharmacy. Patient moved 5 hours away and will no longer be able to follow with [facility]. I advised patient she should look for cardiologist in her area to take over, as once prescription runs out, she will need it refilled from new PCP and/or cardiologist. Pt v/u.
